# Patient Record
Sex: Male | Born: 1985 | Race: White | Hispanic: No | Marital: Single | Smoking: Current every day smoker
Health system: Southern US, Community
[De-identification: ages and names within clinical notes are randomized; demographics above are authoritative.]

## PROBLEM LIST (undated history)

## (undated) DIAGNOSIS — Z969 Presence of functional implant, unspecified: Secondary | ICD-10-CM

## (undated) DIAGNOSIS — K08109 Complete loss of teeth, unspecified cause, unspecified class: Secondary | ICD-10-CM

## (undated) DIAGNOSIS — R0989 Other specified symptoms and signs involving the circulatory and respiratory systems: Secondary | ICD-10-CM

## (undated) DIAGNOSIS — F319 Bipolar disorder, unspecified: Secondary | ICD-10-CM

## (undated) DIAGNOSIS — Z972 Presence of dental prosthetic device (complete) (partial): Secondary | ICD-10-CM

## (undated) HISTORY — PX: FRACTURE SURGERY: SHX138

---
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
## 2007-11-04 ENCOUNTER — Emergency Department (HOSPITAL_COMMUNITY): Admission: EM | Admit: 2007-11-04 | Discharge: 2007-11-05 | Payer: Self-pay | Admitting: Emergency Medicine

## 2010-12-29 LAB — DIFFERENTIAL
Basophils Relative: 0
Lymphs Abs: 1.7
Monocytes Relative: 6
Neutro Abs: 10.4 — ABNORMAL HIGH
Neutrophils Relative %: 80 — ABNORMAL HIGH

## 2010-12-29 LAB — COMPREHENSIVE METABOLIC PANEL
Alkaline Phosphatase: 84
BUN: 9
CO2: 22
Chloride: 110
GFR calc non Af Amer: >60
Glucose, Bld: 77
Potassium: 3.7
Total Bilirubin: 0.8

## 2010-12-29 LAB — CBC
MCHC: 34.3
RBC: 4.74
WBC: 13 — ABNORMAL HIGH

## 2014-04-22 ENCOUNTER — Emergency Department (HOSPITAL_BASED_OUTPATIENT_CLINIC_OR_DEPARTMENT_OTHER): Payer: Self-pay

## 2014-04-22 ENCOUNTER — Emergency Department (HOSPITAL_BASED_OUTPATIENT_CLINIC_OR_DEPARTMENT_OTHER)
Admission: EM | Admit: 2014-04-22 | Discharge: 2014-04-23 | Disposition: A | Discharge status: Home / Self Care | Payer: Self-pay | Attending: Emergency Medicine | Admitting: Emergency Medicine

## 2014-04-22 ENCOUNTER — Encounter (HOSPITAL_BASED_OUTPATIENT_CLINIC_OR_DEPARTMENT_OTHER): Payer: Self-pay | Admitting: *Deleted

## 2014-04-22 DIAGNOSIS — Z720 Tobacco use: Secondary | ICD-10-CM | POA: Insufficient documentation

## 2014-04-22 DIAGNOSIS — N50811 Right testicular pain: Secondary | ICD-10-CM

## 2014-04-22 DIAGNOSIS — N503 Cyst of epididymis: Secondary | ICD-10-CM | POA: Insufficient documentation

## 2014-04-22 DIAGNOSIS — N508 Other specified disorders of male genital organs: Secondary | ICD-10-CM | POA: Insufficient documentation

## 2014-04-22 DIAGNOSIS — N50819 Testicular pain, unspecified: Secondary | ICD-10-CM

## 2014-04-22 DIAGNOSIS — R52 Pain, unspecified: Secondary | ICD-10-CM

## 2014-04-22 DIAGNOSIS — Z792 Long term (current) use of antibiotics: Secondary | ICD-10-CM | POA: Insufficient documentation

## 2014-04-22 NOTE — ED Provider Notes (Signed)
CSN: [id number]     Arrival date & time 04/22/14  2119 History  This chart was scribed for Frank SeamenJohn L [name], MD by [name] [name], ED Scribe. This patient was seen in room MH08/MH08 and the patient's care was started at 12:01 AM.   Chief Complaint  Patient presents with  . Testicle Pain     HPI Comments: [name] is a 29 y.o. male who presents to the Emergency Department complaining of moderate to severe right testicular pain that began 5 hours ago. Pain is worse with movement and improved with rest in certain positions. Patient denies injury. He denies associated dysuria, flank pain, hematuria or urethral discharge. Patient states he feels "hot" at times. Patient is sexually active, in a monogamous relationship. He states that he does not use condoms.   History reviewed. No pertinent past medical history. History reviewed. No pertinent past surgical history. History reviewed. No pertinent family history. History  Substance Use Topics  . Smoking status: Current Every Day Smoker -- 1.00 packs/day    Types: Cigarettes  . Smokeless tobacco: Not on file  . Alcohol Use: No   Review of Systems   A complete 10 system review of systems was obtained and all systems are negative except as noted in the HPI and PMH.    Allergies  Review of patient's allergies indicates no known allergies.  Home Medications   Prior to Admission medications   Medication Sig Start Date End Date Taking? Authorizing Provider  HYDROcodone-acetaminophen (NORCO/VICODIN) 5-325 MG per tablet Take 1-2 tablets by mouth every 6 (six) hours as needed (for pain). 04/23/14   Carlisle BeersJohn L Jazz Biddy, MD  levofloxacin (LEVAQUIN) 500 MG tablet Take 1 tablet (500 mg total) by mouth daily. 04/23/14   Carlisle BeersJohn L [name], MD   Triage Vitals: BP 138/91 mmHg  Pulse 75  Temp(Src) 97.7 F (36.5 C) (Oral)  Resp 16  Ht 5\' 6"  (1.676 m)  Wt 140 lb (63.504 kg)  BMI 22.61 kg/m2  SpO2 100%  Physical Exam  Nursing note and vitals  reviewed. General: Well-developed, well-nourished male in no acute distress; appearance consistent with age of record HENT: normocephalic; atraumatic Eyes: pupils equal, round and reactive to light; extraocular muscles intact Neck: supple Heart: regular rate and rhythm Lungs: clear to auscultation bilaterally Abdomen: soft; nondistended; nontender; no masses or hepatosplenomegaly; bowel sounds present GU: no CVA tenderness; Tanner 5 male, circumcised; no urethral discharge; palpable non-tender cyst of left epididymus; mild tenderness of right testicle; no hernias palpated in supine and standing upright positions Extremities: No deformity; full range of motion; pulses normal Neurologic: Awake, alert and oriented; motor function intact in all extremities and symmetric; no facial droop Skin: Warm and dry Psychiatric: Normal mood and affect  ED Course  Procedures (including critical care time)  DIAGNOSTIC STUDIES: Oxygen Saturation is 100% on RA, normal by my interpretation.    COORDINATION OF CARE: 12:12 AM- Ordered diagnostic imaging of scrotum. Discussed plans to treat patient for possible epididymitis and/or orchitis and will refer to urology if symptoms do not improve. Pt advised of plan for treatment and pt agrees.   MDM  Nursing notes and vitals signs, including pulse oximetry, reviewed.  Summary of this visit's results, reviewed by myself:  Imaging Studies: Kore[place] Scrotum  04/22/2014   CLINICAL DATA:  Sudden onset of right scrotal pain, bleeding 3 hr ago.  EXAM: SCROTAL ULTRASOUND  DOPPLER ULTRASOUND OF THE TESTICLES  TECHNIQUE: Complete ultrasound examination of the testicles, epididymis, and other scrotal structures  was performed. Color and spectral Doppler ultrasound were also utilized to evaluate blood flow to the testicles.  COMPARISON:  None.  FINDINGS: Right testicle  Measurements: 3.9 cm x 2.5 cm x 2.8 cm. No mass or microlithiasis visualized.  Left testicle  Measurements: 3.8  cm x 2.5 cm x 2.6 cm. No mass or microlithiasis visualized.  Right epididymis:  Normal in size and appearance.  Left epididymis: Epididymal head cyst measuring 14 mm in greatest dimension. No other epididymal abnormality.  Hydrocele:  No significant hydrocele.  Varicocele:  None visualized.  Pulsed Doppler interrogation of both testes demonstrates low resistance arterial and venous waveforms bilaterally.  IMPRESSION: 1. No acute findings. No evidence of a testicular mass, torsion or of epididymitis/ orchitis. 2. 14 mm left epididymal head cyst.  No other abnormalities.   Electronically Signed   By: [name] M.D.   On: 04/22/2014 23:04   [place] Art/ven Flow Abd Pelv Doppler  04/22/2014   CLINICAL DATA:  Sudden onset of right scrotal pain, bleeding 3 hr ago.  EXAM: SCROTAL ULTRASOUND  DOPPLER ULTRASOUND OF THE TESTICLES  TECHNIQUE: Complete ultrasound examination of the testicles, epididymis, and other scrotal structures was performed. Color and spectral Doppler ultrasound were also utilized to evaluate blood flow to the testicles.  COMPARISON:  None.  FINDINGS: Right testicle  Measurements: 3.9 cm x 2.5 cm x 2.8 cm. No mass or microlithiasis visualized.  Left testicle  Measurements: 3.8 cm x 2.5 cm x 2.6 cm. No mass or microlithiasis visualized.  Right epididymis:  Normal in size and appearance.  Left epididymis: Epididymal head cyst measuring 14 mm in greatest dimension. No other epididymal abnormality.  Hydrocele:  No significant hydrocele.  Varicocele:  None visualized.  Pulsed Doppler interrogation of both testes demonstrates low resistance arterial and venous waveforms bilaterally.  IMPRESSION: 1. No acute findings. No evidence of a testicular mass, torsion or of epididymitis/ orchitis. 2. 14 mm left epididymal head cyst.  No other abnormalities.   Electronically Signed   By: [name] M.D.   On: 04/22/2014 23:04     Final diagnoses:  Pain in right testicle  Epididymal cyst    I personally  performed the services described in this documentation, which was scribed in my presence. The recorded information has been reviewed and is accurate.  Frank Seamen, MD 04/23/14 [phone]

## 2014-04-22 NOTE — ED Notes (Signed)
Pt c/o right testicle pain x 2 hrs denies injury

## 2014-04-22 NOTE — ED Notes (Signed)
C/o rt testicular pain x 2.5 hours  Denies inj

## 2014-04-23 LAB — GC/CHLAMYDIA PROBE AMP (~~LOC~~) NOT AT ARMC
Chlamydia: NEGATIVE
Neisseria Gonorrhea: NEGATIVE

## 2014-04-23 MED ORDER — HYDROCODONE-ACETAMINOPHEN 5-325 MG PO TABS: 1.0000 | ORAL_TABLET | Freq: Four times a day (QID) | ORAL | Status: DC | PRN

## 2014-04-23 MED ORDER — LIDOCAINE HCL (PF) 1 % IJ SOLN
INTRAMUSCULAR | Status: AC
  Administered 2014-04-23: 1.2 mL
  Filled 2014-04-23: qty 5

## 2014-04-23 MED ORDER — HYDROCODONE-ACETAMINOPHEN 5-325 MG PO TABS
2.0000 | ORAL_TABLET | Freq: Once | ORAL | Status: AC
  Administered 2014-04-23: 2 via ORAL
  Filled 2014-04-23: qty 2

## 2014-04-23 MED ORDER — CEFTRIAXONE SODIUM 250 MG IJ SOLR
250.0000 mg | Freq: Once | INTRAMUSCULAR | Status: AC
  Administered 2014-04-23: 250 mg via INTRAMUSCULAR
  Filled 2014-04-23: qty 250

## 2014-04-23 MED ORDER — LEVOFLOXACIN 500 MG PO TABS
500.0000 mg | ORAL_TABLET | Freq: Every day | ORAL | Status: DC
Start: 2014-04-23 — End: 2015-08-26

## 2015-05-04 HISTORY — PX: ORIF ANKLE FRACTURE: SHX5408

## 2015-08-01 DIAGNOSIS — Z969 Presence of functional implant, unspecified: Secondary | ICD-10-CM

## 2015-08-01 HISTORY — DX: Presence of functional implant, unspecified: Z96.9

## 2015-08-26 ENCOUNTER — Encounter (HOSPITAL_BASED_OUTPATIENT_CLINIC_OR_DEPARTMENT_OTHER): Payer: Self-pay | Admitting: *Deleted

## 2015-08-26 DIAGNOSIS — R0989 Other specified symptoms and signs involving the circulatory and respiratory systems: Secondary | ICD-10-CM

## 2015-08-26 HISTORY — DX: Other specified symptoms and signs involving the circulatory and respiratory systems: R09.89

## 2015-09-02 ENCOUNTER — Ambulatory Visit (HOSPITAL_BASED_OUTPATIENT_CLINIC_OR_DEPARTMENT_OTHER): Payer: Self-pay | Admitting: Certified Registered"

## 2015-09-02 ENCOUNTER — Encounter (HOSPITAL_BASED_OUTPATIENT_CLINIC_OR_DEPARTMENT_OTHER): Payer: Self-pay | Admitting: Certified Registered"

## 2015-09-02 ENCOUNTER — Ambulatory Visit (HOSPITAL_BASED_OUTPATIENT_CLINIC_OR_DEPARTMENT_OTHER)
Admission: RE | Admit: 2015-09-02 | Discharge: 2015-09-02 | Disposition: A | Discharge status: Home / Self Care | Payer: Self-pay | Source: Ambulatory Visit | Attending: Orthopedic Surgery | Admitting: Orthopedic Surgery

## 2015-09-02 ENCOUNTER — Encounter (HOSPITAL_BASED_OUTPATIENT_CLINIC_OR_DEPARTMENT_OTHER)
Admission: RE | Disposition: A | Discharge status: Home / Self Care | Payer: Self-pay | Source: Ambulatory Visit | Attending: Orthopedic Surgery

## 2015-09-02 DIAGNOSIS — Y838 Other surgical procedures as the cause of abnormal reaction of the patient, or of later complication, without mention of misadventure at the time of the procedure: Secondary | ICD-10-CM | POA: Insufficient documentation

## 2015-09-02 DIAGNOSIS — Z969 Presence of functional implant, unspecified: Secondary | ICD-10-CM

## 2015-09-02 DIAGNOSIS — T84498A Other mechanical complication of other internal orthopedic devices, implants and grafts, initial encounter: Secondary | ICD-10-CM | POA: Insufficient documentation

## 2015-09-02 DIAGNOSIS — F17200 Nicotine dependence, unspecified, uncomplicated: Secondary | ICD-10-CM | POA: Insufficient documentation

## 2015-09-02 HISTORY — DX: Presence of dental prosthetic device (complete) (partial): Z97.2

## 2015-09-02 HISTORY — DX: Other specified symptoms and signs involving the circulatory and respiratory systems: R09.89

## 2015-09-02 HISTORY — PX: HARDWARE REMOVAL: SHX979

## 2015-09-02 HISTORY — DX: Presence of functional implant, unspecified: Z96.9

## 2015-09-02 HISTORY — DX: Complete loss of teeth, unspecified cause, unspecified class: K08.109

## 2015-09-02 SURGERY — REMOVAL, HARDWARE
Anesthesia: General | Site: Ankle | Laterality: Left

## 2015-09-02 MED ORDER — FENTANYL CITRATE (PF) 100 MCG/2ML IJ SOLN
50.0000 ug | INTRAMUSCULAR | Status: AC | PRN
  Administered 2015-09-02: 50 ug via INTRAVENOUS
  Administered 2015-09-02: 100 ug via INTRAVENOUS
  Administered 2015-09-02 (×3): 50 ug via INTRAVENOUS

## 2015-09-02 MED ORDER — FENTANYL CITRATE (PF) 100 MCG/2ML IJ SOLN
INTRAMUSCULAR | Status: AC
  Filled 2015-09-02: qty 2

## 2015-09-02 MED ORDER — ACETAMINOPHEN 500 MG PO TABS
1000.0000 mg | ORAL_TABLET | Freq: Once | ORAL | Status: AC
Start: 2015-09-02 — End: 2015-09-02
  Administered 2015-09-02: 1000 mg via ORAL

## 2015-09-02 MED ORDER — LACTATED RINGERS IV SOLN
INTRAVENOUS | Status: DC
  Administered 2015-09-02 (×2): via INTRAVENOUS

## 2015-09-02 MED ORDER — CEFAZOLIN SODIUM-DEXTROSE 2-4 GM/100ML-% IV SOLN
2.0000 g | INTRAVENOUS | Status: AC
  Administered 2015-09-02: 2 g via INTRAVENOUS

## 2015-09-02 MED ORDER — KETOROLAC TROMETHAMINE 30 MG/ML IJ SOLN
INTRAMUSCULAR | Status: AC
  Filled 2015-09-02: qty 1

## 2015-09-02 MED ORDER — ONDANSETRON HCL 4 MG/2ML IJ SOLN
INTRAMUSCULAR | Status: DC | PRN
  Administered 2015-09-02: 4 mg via INTRAVENOUS

## 2015-09-02 MED ORDER — BUPIVACAINE HCL (PF) 0.5 % IJ SOLN
INTRAMUSCULAR | Status: AC
  Filled 2015-09-02: qty 30

## 2015-09-02 MED ORDER — GLYCOPYRROLATE 0.2 MG/ML IJ SOLN: 0.2000 mg | Freq: Once | INTRAMUSCULAR | Status: DC | PRN

## 2015-09-02 MED ORDER — PROPOFOL 10 MG/ML IV BOLUS
INTRAVENOUS | Status: AC
  Filled 2015-09-02: qty 20

## 2015-09-02 MED ORDER — FENTANYL CITRATE (PF) 100 MCG/2ML IJ SOLN
25.0000 ug | INTRAMUSCULAR | Status: DC | PRN
  Administered 2015-09-02 (×3): 50 ug via INTRAVENOUS

## 2015-09-02 MED ORDER — BUPIVACAINE HCL (PF) 0.5 % IJ SOLN
INTRAMUSCULAR | Status: DC | PRN
  Administered 2015-09-02: 10 mL

## 2015-09-02 MED ORDER — DEXTROSE-NACL 5-0.45 % IV SOLN: 100.0000 mL/h | INTRAVENOUS | Status: DC

## 2015-09-02 MED ORDER — LIDOCAINE HCL (CARDIAC) 20 MG/ML IV SOLN
INTRAVENOUS | Status: DC | PRN
  Administered 2015-09-02: 80 mg via INTRAVENOUS

## 2015-09-02 MED ORDER — MIDAZOLAM HCL 2 MG/2ML IJ SOLN
1.0000 mg | INTRAMUSCULAR | Status: DC | PRN
  Administered 2015-09-02: 2 mg via INTRAVENOUS

## 2015-09-02 MED ORDER — HYDROCODONE-ACETAMINOPHEN 5-325 MG PO TABS: 1.0000 | ORAL_TABLET | ORAL | Status: AC | PRN

## 2015-09-02 MED ORDER — DEXAMETHASONE SODIUM PHOSPHATE 10 MG/ML IJ SOLN
INTRAMUSCULAR | Status: DC | PRN
  Administered 2015-09-02: 10 mg via INTRAVENOUS

## 2015-09-02 MED ORDER — ONDANSETRON HCL 4 MG/2ML IJ SOLN
INTRAMUSCULAR | Status: AC
  Filled 2015-09-02: qty 2

## 2015-09-02 MED ORDER — SCOPOLAMINE 1 MG/3DAYS TD PT72: 1.0000 | MEDICATED_PATCH | Freq: Once | TRANSDERMAL | Status: DC | PRN

## 2015-09-02 MED ORDER — PROMETHAZINE HCL 25 MG/ML IJ SOLN: 6.2500 mg | INTRAMUSCULAR | Status: DC | PRN

## 2015-09-02 MED ORDER — KETOROLAC TROMETHAMINE 30 MG/ML IJ SOLN
30.0000 mg | Freq: Once | INTRAMUSCULAR | Status: AC | PRN
  Administered 2015-09-02: 30 mg via INTRAVENOUS

## 2015-09-02 MED ORDER — PROPOFOL 10 MG/ML IV BOLUS
INTRAVENOUS | Status: DC | PRN
  Administered 2015-09-02: 200 mg via INTRAVENOUS

## 2015-09-02 MED ORDER — DEXAMETHASONE SODIUM PHOSPHATE 10 MG/ML IJ SOLN
INTRAMUSCULAR | Status: AC
  Filled 2015-09-02: qty 1

## 2015-09-02 MED ORDER — CEFAZOLIN SODIUM-DEXTROSE 2-4 GM/100ML-% IV SOLN
INTRAVENOUS | Status: AC
  Filled 2015-09-02: qty 100

## 2015-09-02 MED ORDER — CHLORHEXIDINE GLUCONATE 4 % EX LIQD: 60.0000 mL | Freq: Once | CUTANEOUS | Status: DC

## 2015-09-02 MED ORDER — MIDAZOLAM HCL 2 MG/2ML IJ SOLN
INTRAMUSCULAR | Status: AC
  Filled 2015-09-02: qty 2

## 2015-09-02 MED ORDER — ACETAMINOPHEN 500 MG PO TABS
ORAL_TABLET | ORAL | Status: AC
  Filled 2015-09-02: qty 2

## 2015-09-02 MED ORDER — LIDOCAINE 2% (20 MG/ML) 5 ML SYRINGE
INTRAMUSCULAR | Status: AC
  Filled 2015-09-02: qty 5

## 2015-09-02 SURGICAL SUPPLY — 67 items
BANDAGE ACE 4X5 VEL STRL LF (GAUZE/BANDAGES/DRESSINGS) ×3 IMPLANT
BLADE SURG 15 STRL LF DISP TIS (BLADE) ×1 IMPLANT
BLADE SURG 15 STRL SS (BLADE) ×3
BNDG CMPR 9X4 STRL LF SNTH (GAUZE/BANDAGES/DRESSINGS) ×1
BNDG COHESIVE 4X5 TAN STRL (GAUZE/BANDAGES/DRESSINGS) ×3 IMPLANT
BNDG ESMARK 4X9 LF (GAUZE/BANDAGES/DRESSINGS) ×3 IMPLANT
CHLORAPREP W/TINT 26ML (MISCELLANEOUS) ×3 IMPLANT
CLOSURE STERI-STRIP 1/2X4 (GAUZE/BANDAGES/DRESSINGS) ×1
CLSR STERI-STRIP ANTIMIC 1/2X4 (GAUZE/BANDAGES/DRESSINGS) ×2 IMPLANT
COVER BACK TABLE 60X90IN (DRAPES) ×3 IMPLANT
CUFF TOURNIQUET SINGLE 24IN (TOURNIQUET CUFF) ×2 IMPLANT
CUFF TOURNIQUET SINGLE 34IN LL (TOURNIQUET CUFF) IMPLANT
DECANTER SPIKE VIAL GLASS SM (MISCELLANEOUS) IMPLANT
DRAPE EXTREMITY T 121X128X90 (DRAPE) ×3 IMPLANT
DRAPE IMP U-DRAPE 54X76 (DRAPES) ×3 IMPLANT
DRAPE OEC MINIVIEW 54X84 (DRAPES) ×3 IMPLANT
DRAPE SURG 17X23 STRL (DRAPES) IMPLANT
DRAPE U-SHAPE 47X51 STRL (DRAPES) IMPLANT
DRSG EMULSION OIL 3X3 NADH (GAUZE/BANDAGES/DRESSINGS) ×3 IMPLANT
ELECT REM PT RETURN 9FT ADLT (ELECTROSURGICAL) ×3
ELECTRODE REM PT RTRN 9FT ADLT (ELECTROSURGICAL) ×1 IMPLANT
GAUZE SPONGE 4X4 12PLY STRL (GAUZE/BANDAGES/DRESSINGS) ×3 IMPLANT
GAUZE XEROFORM 1X8 LF (GAUZE/BANDAGES/DRESSINGS) IMPLANT
GLOVE BIO SURGEON STRL SZ 6.5 (GLOVE) ×1 IMPLANT
GLOVE BIO SURGEON STRL SZ7.5 (GLOVE) ×6 IMPLANT
GLOVE BIO SURGEON STRL SZ8.5 (GLOVE) ×2 IMPLANT
GLOVE BIO SURGEONS STRL SZ 6.5 (GLOVE) ×1
GLOVE BIOGEL PI IND STRL 7.0 (GLOVE) IMPLANT
GLOVE BIOGEL PI IND STRL 8 (GLOVE) ×2 IMPLANT
GLOVE BIOGEL PI IND STRL 8.5 (GLOVE) IMPLANT
GLOVE BIOGEL PI INDICATOR 7.0 (GLOVE) ×2
GLOVE BIOGEL PI INDICATOR 8 (GLOVE) ×4
GLOVE BIOGEL PI INDICATOR 8.5 (GLOVE) ×2
GOWN STRL REUS W/ TWL LRG LVL3 (GOWN DISPOSABLE) ×4 IMPLANT
GOWN STRL REUS W/ TWL XL LVL3 (GOWN DISPOSABLE) ×1 IMPLANT
GOWN STRL REUS W/TWL LRG LVL3 (GOWN DISPOSABLE) ×6
GOWN STRL REUS W/TWL XL LVL3 (GOWN DISPOSABLE) ×6
NDL HYPO 25X1 1.5 SAFETY (NEEDLE) ×1 IMPLANT
NEEDLE HYPO 25X1 1.5 SAFETY (NEEDLE) ×3 IMPLANT
NS IRRIG 1000ML POUR BTL (IV SOLUTION) ×3 IMPLANT
PACK BASIN DAY SURGERY FS (CUSTOM PROCEDURE TRAY) ×3 IMPLANT
PAD CAST 4YDX4 CTTN HI CHSV (CAST SUPPLIES) ×1 IMPLANT
PADDING CAST ABS 4INX4YD NS (CAST SUPPLIES) ×2
PADDING CAST ABS COTTON 4X4 ST (CAST SUPPLIES) ×1 IMPLANT
PADDING CAST COTTON 4X4 STRL (CAST SUPPLIES) ×3
PENCIL BUTTON HOLSTER BLD 10FT (ELECTRODE) ×3 IMPLANT
SHEET MEDIUM DRAPE 40X70 STRL (DRAPES) IMPLANT
SLEEVE SCD COMPRESS KNEE MED (MISCELLANEOUS) IMPLANT
SPONGE LAP 4X18 X RAY DECT (DISPOSABLE) ×3 IMPLANT
STOCKINETTE 4X48 STRL (DRAPES) IMPLANT
STOCKINETTE 6  STRL (DRAPES) ×2
STOCKINETTE 6 STRL (DRAPES) ×1 IMPLANT
SUCTION FRAZIER HANDLE 10FR (MISCELLANEOUS)
SUCTION TUBE FRAZIER 10FR DISP (MISCELLANEOUS) IMPLANT
SUT ETHILON 3 0 PS 1 (SUTURE) ×2 IMPLANT
SUT MON AB 4-0 PC3 18 (SUTURE) IMPLANT
SUT PROLENE 3 0 PS 2 (SUTURE) IMPLANT
SUT VIC AB 2-0 SH 27 (SUTURE) ×3
SUT VIC AB 2-0 SH 27XBRD (SUTURE) IMPLANT
SUT VIC AB 3-0 FS2 27 (SUTURE) IMPLANT
SYR BULB 3OZ (MISCELLANEOUS) ×3 IMPLANT
SYR CONTROL 10ML LL (SYRINGE) IMPLANT
TOWEL OR 17X24 6PK STRL BLUE (TOWEL DISPOSABLE) ×3 IMPLANT
TOWEL OR NON WOVEN STRL DISP B (DISPOSABLE) ×3 IMPLANT
TUBE CONNECTING 20'X1/4 (TUBING)
TUBE CONNECTING 20X1/4 (TUBING) IMPLANT
UNDERPAD 30X30 (UNDERPADS AND DIAPERS) ×3 IMPLANT

## 2015-09-02 NOTE — Discharge Instructions (Signed)
  Post Anesthesia Home Care Instructions  Activity: Get plenty of rest for the remainder of the day. A responsible adult should stay with you for 24 hours following the procedure.  For the next 24 hours, DO NOT: -Drive a car -Operate machinery -Drink alcoholic beverages -Take any medication unless instructed by your physician -Make any legal decisions or sign important papers.  Meals: Start with liquid foods such as gelatin or soup. Progress to regular foods as tolerated. Avoid greasy, spicy, heavy foods. If nausea and/or vomiting occur, drink only clear liquids until the nausea and/or vomiting subsides. Call your physician if vomiting continues.  Special Instructions/Symptoms: Your throat may feel dry or sore from the anesthesia or the breathing tube placed in your throat during surgery. If this causes discomfort, gargle with warm salt water. The discomfort should disappear within 24 hours.  If you had a scopolamine patch placed behind your ear for the management of post- operative nausea and/or vomiting:  1. The medication in the patch is effective for 72 hours, after which it should be removed.  Wrap patch in a tissue and discard in the trash. Wash hands thoroughly with soap and water. 2. You may remove the patch earlier than 72 hours if you experience unpleasant side effects which may include dry mouth, dizziness or visual disturbances. 3. Avoid touching the patch. Wash your hands with soap and water after contact with the patch.    Post Anesthesia Home Care Instructions  Activity: Get plenty of rest for the remainder of the day. A responsible adult should stay with you for 24 hours following the procedure.  For the next 24 hours, DO NOT: -Drive a car -Operate machinery -Drink alcoholic beverages -Take any medication unless instructed by your physician -Make any legal decisions or sign important papers.  Meals: Start with liquid foods such as gelatin or soup. Progress to  regular foods as tolerated. Avoid greasy, spicy, heavy foods. If nausea and/or vomiting occur, drink only clear liquids until the nausea and/or vomiting subsides. Call your physician if vomiting continues.  Special Instructions/Symptoms: Your throat may feel dry or sore from the anesthesia or the breathing tube placed in your throat during surgery. If this causes discomfort, gargle with warm salt water. The discomfort should disappear within 24 hours.  If you had a scopolamine patch placed behind your ear for the management of post- operative nausea and/or vomiting:  1. The medication in the patch is effective for 72 hours, after which it should be removed.  Wrap patch in a tissue and discard in the trash. Wash hands thoroughly with soap and water. 2. You may remove the patch earlier than 72 hours if you experience unpleasant side effects which may include dry mouth, dizziness or visual disturbances. 3. Avoid touching the patch. Wash your hands with soap and water after contact with the patch.    

## 2015-09-02 NOTE — Op Note (Signed)
09/02/2015  12:50 PM  PATIENT:  [name] J Santori    PRE-OPERATIVE DIAGNOSIS:  Other mechanical complication of the other internal orthopedic devices, implants and grafts, initial encounter  T84.498A  POST-OPERATIVE DIAGNOSIS:  Same  PROCEDURE:  LEFT ANKLE HARDWARE REMOVAL  SURGEON:  Justyna Timoney, Jewel BaizeIMOTHY D, MD  ASSISTANT: Dennard SchaumannBrian petrarcha, PA-C, She was present and scrubbed throughout the case, critical for completion in a timely fashion, and for retraction, instrumentation, and closure.   ANESTHESIA:   gen  PREOPERATIVE INDICATIONS:  Frank LeydenCody J Pierpoint is a  30 y.o. male with a diagnosis of Other mechanical complication of the other internal orthopedic devices, implants and grafts, initial encounter  T84.498A who failed conservative measures and elected for surgical management.    The risks benefits and alternatives were discussed with the patient preoperatively including but not limited to the risks of infection, bleeding, nerve injury, cardiopulmonary complications, the need for revision surgery, among others, and the patient was willing to proceed.  OPERATIVE IMPLANTS: none  OPERATIVE FINDINGS: complete removal of hardware  BLOOD LOSS: min  COMPLICATIONS: none  TOURNIQUET TIME: 30  OPERATIVE PROCEDURE:  Patient was identified in the preoperative holding area and site was marked by me He was transported to the operating theater and placed on the table in supine position taking care to pad all bony prominences. After a preincinduction time out anesthesia was induced. The left lower extremity was prepped and draped in normal sterile fashion and a pre-incision timeout was performed. He received ancef for preoperative antibiotics.   Made a lateral incision protecting superficial peroneal nerve. I dissected down to his periosteum incised this in line with the incision and medially identified his plate I removed the 2 large syndesmotic screws I then removed his other 4 screws.  X-rays a Freer to  completely remove the plate. They came out intact and whole  Next I placed a Hohmann across the front of the fibula was able to see his interlock screw so I'm remove this completely as well. I then thoroughly irrigated his incision I closed his periosteum over top of the bone I closed the skin with a nylon stitch. Sterile dressing was applied and he was awoken and taken the PACU in stable condition.  POST OPERATIVE PLAN: Weightbearing as tolerated no high energy activity for 1 month ambulate for DVT prophylaxis    This note was generated using a template and dragon dictation system. In light of that, I have reviewed the note and all aspects of it are applicable to this case. Any dictation errors are due to the computerized dictation system.

## 2015-09-02 NOTE — H&P (Signed)
  [facility] [address]   [address] [place], [place] [id number] [phone] A Division of [facility]  Loreta Aveaniel F. Deontaye Civello, M.D.   Robert A. Thurston HoleWainer, M.D.   Burnell BlanksW. Dan Caffrey, M.D.   Eulas PostJoshua P. Landau, M.D.   Lunette StandsAnna Voytek, M.D. Jewel Baizeimothy D. Eulah PontMurphy, M.D.  Buford DresserWesley R. Ibazebo, M.D.  Estell HarpinJames S. Kramer, M.D.    Melina Fiddlerebecca S. Bassett, M.D. Janalee DaneBrittney Kelly, PA- C  Mary L. Dub MikesStanbery, PA-C  Kirstin A. Shepperson, PA-C  Josh Gaphadwell, PA-C  LewisvilleBrandon Parry, [place] Dakota[place]A-C  RE: Frank LawrenceHutson, [name]   [id number]      DOB: 08-09-1985  PROGRESS REPORT: 07-18-15  REASON FOR VISIT: Follow-up open reduction internal fixation left ankle lateral malleolus and syndesmosis performed at an outside facility on 05-13-15.   HPI:  Frank BattenCody is two months out. He has recovered well. He has advanced to full weight bearing in a regular shoe part time and in a boot part time. His hardware is prominent. We discussed hardware removal in May. He is all set for this.   Please see associated documentation for this clinic visit for further past medical, family, surgical and social history, review of systems, and exam findings as this was reviewed by me.  EXAMINATION: Well appearing male no apparent distress. The left lower extremity incisions are well healed. He has prominent hardware which is tender. He has a good range of motion of the ankle. No swelling.    IMAGES: X-rays reviewed by me:   X-rays show a stable alignment of his hardware.    ASSESSMENT/PLAN: He is doing well at this time. We will continue with our plan for hardware removal in May. He will continue to advance his activity in the meantime.   Jewel Baizeimothy D.  Eulah PontMurphy, M.D.  Electronically verified by Jewel Baizeimothy D. Eulah PontMurphy, M.D. TDM:BR:jgc  D  07-20-2015 T  07-20-2015

## 2015-09-02 NOTE — Anesthesia Preprocedure Evaluation (Signed)
Anesthesia Evaluation  Patient identified by MRN, date of birth, ID band Patient awake    Reviewed: Allergy & Precautions, NPO status , Patient's Chart, lab work & pertinent test results  Airway Mallampati: II  TM Distance: >3 FB Neck ROM: Full    Dental no notable dental hx.    Pulmonary Current Smoker,    Pulmonary exam normal breath sounds clear to auscultation       Cardiovascular negative cardio ROS Normal cardiovascular exam Rhythm:Regular Rate:Normal     Neuro/Psych negative neurological ROS  negative psych ROS   GI/Hepatic negative GI ROS, Neg liver ROS,   Endo/Other  negative endocrine ROS  Renal/GU negative Renal ROS  negative genitourinary   Musculoskeletal negative musculoskeletal ROS (+)   Abdominal   Peds negative pediatric ROS (+)  Hematology negative hematology ROS (+)   Anesthesia Other Findings   Reproductive/Obstetrics negative OB ROS                             Anesthesia Physical Anesthesia Plan  ASA: II  Anesthesia Plan: General   Post-op Pain Management:    Induction: Intravenous  Airway Management Planned: LMA  Additional Equipment:   Intra-op Plan:   Post-operative Plan: Extubation in OR  Informed Consent: I have reviewed the patients History and Physical, chart, labs and discussed the procedure including the risks, benefits and alternatives for the proposed anesthesia with the patient or authorized representative who has indicated his/her understanding and acceptance.   Dental advisory given  Plan Discussed with: CRNA and Surgeon  Anesthesia Plan Comments:         Anesthesia Quick Evaluation  

## 2015-09-02 NOTE — Anesthesia Postprocedure Evaluation (Signed)
Anesthesia Post Note  Patient: [name]  Procedure(s) Performed: Procedure(s) (LRB): LEFT ANKLE HARDWARE REMOVAL (Left)  Patient location during evaluation: PACU Anesthesia Type: General Level of consciousness: awake and alert Pain management: pain level controlled Vital Signs Assessment: post-procedure vital signs reviewed and stable Respiratory status: spontaneous breathing, nonlabored ventilation, respiratory function stable and patient connected to nasal cannula oxygen Cardiovascular status: blood pressure returned to baseline and stable Postop Assessment: no signs of nausea or vomiting Anesthetic complications: no    Last Vitals:  Filed Vitals:   09/02/15 1330 09/02/15 1345  BP: 144/99 140/103  Pulse: 109 96  Temp:    Resp: 17 25    Last Pain:  Filed Vitals:   09/02/15 1347  PainSc: 7                  Khani Paino S

## 2015-09-02 NOTE — Anesthesia Procedure Notes (Signed)
Procedure Name: LMA Insertion Date/Time: 09/02/2015 12:27 PM Performed by: Curly ShoresRAFT, Laquiesha Piacente W Pre-anesthesia Checklist: Patient identified, Emergency Drugs available, Suction available and Patient being monitored Patient Re-evaluated:Patient Re-evaluated prior to inductionOxygen Delivery Method: Circle system utilized Preoxygenation: Pre-oxygenation with 100% oxygen Intubation Type: IV induction Ventilation: Mask ventilation without difficulty LMA: LMA inserted LMA Size: 4.0 Number of attempts: 1 Airway Equipment and Method: Bite block Placement Confirmation: positive ETCO2 and breath sounds checked- equal and bilateral Tube secured with: Tape Dental Injury: Teeth and Oropharynx as per pre-operative assessment

## 2015-09-02 NOTE — Interval H&P Note (Signed)
History and Physical Interval Note:  09/02/2015 12:17 PM  [name]  has presented today for surgery, with the diagnosis of Other mechanical complication of the other internal orthopedic devices, implants and grafts, initial encounter  T84.498A  The various methods of treatment have been discussed with the patient and family. After consideration of risks, benefits and other options for treatment, the patient has consented to  Procedure(s): LEFT ANKLE HARDWARE REMOVAL (Left) as a surgical intervention .  The patient's history has been reviewed, patient examined, no change in status, stable for surgery.  I have reviewed the patient's chart and labs.  Questions were answered to the patient's satisfaction.     Chauncy Mangiaracina D

## 2015-09-02 NOTE — Transfer of Care (Signed)
Immediate Anesthesia Transfer of Care Note  Patient: Frank LeydenCody J Stodghill  Procedure(s) Performed: Procedure(s): LEFT ANKLE HARDWARE REMOVAL (Left)  Patient Location: PACU  Anesthesia Type:General  Level of Consciousness: awake, alert , oriented and patient cooperative  Airway & Oxygen Therapy: Patient Spontanous Breathing and Patient connected to face mask oxygen  Post-op Assessment: Report given to RN and Post -op Vital signs reviewed and stable  Post vital signs: Reviewed and stable  Last Vitals: There were no vitals filed for this visit.  Last Pain:  Filed Vitals:   09/02/15 1128  PainSc: 3          Complications: No apparent anesthesia complications

## 2015-09-05 ENCOUNTER — Encounter (HOSPITAL_BASED_OUTPATIENT_CLINIC_OR_DEPARTMENT_OTHER): Payer: Self-pay | Admitting: Orthopedic Surgery

## 2016-12-18 ENCOUNTER — Emergency Department (HOSPITAL_COMMUNITY): Payer: Self-pay

## 2016-12-18 ENCOUNTER — Encounter (HOSPITAL_COMMUNITY): Payer: Self-pay | Admitting: Emergency Medicine

## 2016-12-18 ENCOUNTER — Emergency Department (HOSPITAL_COMMUNITY)
Admission: EM | Admit: 2016-12-18 | Discharge: 2016-12-19 | Disposition: A | Discharge status: Home / Self Care | Payer: Self-pay | Attending: Emergency Medicine | Admitting: Emergency Medicine

## 2016-12-18 DIAGNOSIS — Y999 Unspecified external cause status: Secondary | ICD-10-CM | POA: Insufficient documentation

## 2016-12-18 DIAGNOSIS — Y939 Activity, unspecified: Secondary | ICD-10-CM | POA: Insufficient documentation

## 2016-12-18 DIAGNOSIS — F17210 Nicotine dependence, cigarettes, uncomplicated: Secondary | ICD-10-CM | POA: Insufficient documentation

## 2016-12-18 DIAGNOSIS — S99922A Unspecified injury of left foot, initial encounter: Secondary | ICD-10-CM | POA: Insufficient documentation

## 2016-12-18 DIAGNOSIS — W19XXXA Unspecified fall, initial encounter: Secondary | ICD-10-CM

## 2016-12-18 DIAGNOSIS — Y929 Unspecified place or not applicable: Secondary | ICD-10-CM | POA: Insufficient documentation

## 2016-12-18 DIAGNOSIS — W1789XA Other fall from one level to another, initial encounter: Secondary | ICD-10-CM | POA: Insufficient documentation

## 2016-12-18 DIAGNOSIS — S92009A Unspecified fracture of unspecified calcaneus, initial encounter for closed fracture: Secondary | ICD-10-CM | POA: Insufficient documentation

## 2016-12-18 MED ORDER — ONDANSETRON HCL 4 MG/2ML IJ SOLN
4.0000 mg | Freq: Once | INTRAMUSCULAR | Status: AC
Start: 2016-12-18 — End: 2016-12-18
  Administered 2016-12-18: 4 mg via INTRAVENOUS
  Filled 2016-12-18: qty 2

## 2016-12-18 MED ORDER — OXYCODONE-ACETAMINOPHEN 5-325 MG PO TABS
2.0000 | ORAL_TABLET | Freq: Once | ORAL | Status: AC
  Administered 2016-12-18: 2 via ORAL
  Filled 2016-12-18: qty 2

## 2016-12-18 MED ORDER — FENTANYL CITRATE (PF) 100 MCG/2ML IJ SOLN
100.0000 ug | INTRAMUSCULAR | Status: DC | PRN
  Administered 2016-12-18: 100 ug via INTRAVENOUS
  Filled 2016-12-18: qty 2

## 2016-12-18 NOTE — ED Triage Notes (Signed)
[facility] , [facility] , pt. jumped off from second floor at around 8pm this evening, denied LOC ,denied SI, pt. Complained of pain on bil. Ankles,feet at 10./10. Pt. Was barefooted , no report of obvious injury. C-collar in placed. Pt. Alert and oriented x4. Denied HA.

## 2016-12-18 NOTE — ED Notes (Signed)
Pt. Given 2 ice packs. 

## 2016-12-19 ENCOUNTER — Emergency Department (HOSPITAL_COMMUNITY): Payer: Self-pay

## 2016-12-19 MED ORDER — OXYCODONE-ACETAMINOPHEN 5-325 MG PO TABS: 2.0000 | ORAL_TABLET | ORAL | 0 refills | Status: AC | PRN

## 2016-12-19 MED ORDER — HYDROMORPHONE HCL 1 MG/ML IJ SOLN
1.0000 mg | INTRAMUSCULAR | Status: AC | PRN
  Administered 2016-12-19 (×2): 1 mg via INTRAVENOUS
  Filled 2016-12-19 (×2): qty 1

## 2016-12-19 NOTE — Discharge Instructions (Signed)
Both of your ankles/feet have fractures of the heel bone, called the Calcaneous. These often need surgery to fix. The surgery is not done immediately as these injuries cause too much swelling to be able to close the skin. Call Dr. Roda Shutters (Pronounced Deno Etienne) for an appointment in one week. You absolutely cannot bear any weight on your feet until seen by orthopedics, and likely until after surgery

## 2016-12-19 NOTE — Progress Notes (Signed)
Orthopedic Tech Progress Note Patient Details:  [name] 06/08/1985 [id number]  Ortho Devices Type of Ortho Device: Lenora Boys splint, Post (short leg) splint Ortho Device/Splint Location: bi watson jones with posterior short leg splint. watson jones as per drs request Ortho Device/Splint Interventions: Ordered, Application   Trinna Post 12/19/2016, 1:52 AM

## 2016-12-25 ENCOUNTER — Ambulatory Visit (INDEPENDENT_AMBULATORY_CARE_PROVIDER_SITE_OTHER): Admitting: Orthopedic Surgery

## 2016-12-25 ENCOUNTER — Other Ambulatory Visit (INDEPENDENT_AMBULATORY_CARE_PROVIDER_SITE_OTHER): Payer: Self-pay | Admitting: Orthopedic Surgery

## 2016-12-25 DIAGNOSIS — S92002A Unspecified fracture of left calcaneus, initial encounter for closed fracture: Secondary | ICD-10-CM

## 2016-12-25 DIAGNOSIS — S92001A Unspecified fracture of right calcaneus, initial encounter for closed fracture: Secondary | ICD-10-CM | POA: Insufficient documentation

## 2016-12-25 NOTE — Progress Notes (Signed)
Office Visit Note   Patient: [name]           Date of Birth: Sep 23, 1985           MRN: [id number] Visit Date: 12/25/2016              Requested by: Delorse Lek, MD [address] [place], [place] [address] PCP: Delorse Lek, MD  No chief complaint on file.     HPI: Patient is a 31 year old gentleman who states he jumped out of a second-story window landing on both feet on concrete. Patient sustained bilateral calcaneal fractures 1 week ago. Patient is seen today for initial valuation.  Of note patient had a left fibular and syndesmosis injury approximately 2 years ago this underwent open reduction internal fixation and subsequently the hardware was removed by Dr. Renaye Rakers on 6/17, 1 year in 3 months ago.  Assessment & Plan: Visit Diagnoses:  1. Closed fracture of both calcanei, initial encounter     Plan: We will plan for open reduction internal fixation of bilateral calcaneal fractures. Risks and benefits were discussed including arthritis pain and need for additional surgery potential for subtalar fusion potential for left foot amputation due to the previous lateral surgical incision. Patient states he understands wishes to proceed at this time. We will have to [facility] to identify time for surgery.  Follow-Up Instructions: Return in about 1 week (around 01/01/2017).   Ortho Exam  Patient is alert, oriented, no adenopathy, well-dressed, normal affect, normal respiratory effort. Examination patient has good hair growth on both feet. He has good dorsalis pedis pulses bilaterally. There is more swelling in the left lateral aspect the calcaneus compared to the right there is ecchymosis and bruising that goes down into the toes. There is no skin blistering. The lateral incision over the fibula extends down into the soft tissue of the lateral calcaneus.  Review the radiographs shows a tongue type minimally displaced right posterior  facet calcaneus fracture. Radiographs shows a comminuted calcaneal fracture on the left with comminution of the posterior facet a small fracture off the posterior aspect of the talus and a nonunion of the Weber B fibular fracture on the left.  Imaging: No results found. No images are attached to the encounter.  Labs: No results found for: HGBA1C, ESRSEDRATE, CRP, LABURIC, REPTSTATUS, GRAMSTAIN, CULT, LABORGA  Orders:  No orders of the defined types were placed in this encounter.  No orders of the defined types were placed in this encounter.    Procedures: No procedures performed  Clinical Data: No additional findings.  ROS:  All other systems negative, except as noted in the HPI. Review of Systems  Objective: Vital Signs: There were no vitals taken for this visit.  Specialty Comments:  No specialty comments available.  PMFS History: Patient Active Problem List   Diagnosis Date Noted  . Closed fracture of both calcanei 12/25/2016  . Retained orthopedic hardware 09/02/2015   Past Medical History:  Diagnosis Date  . Full dentures   . Retained orthopedic hardware 08/2015   left ankle  . Runny nose 08/26/2015   clear drainage, per pt.    No family history on file.  Past Surgical History:  Procedure Laterality Date  . HARDWARE REMOVAL Left 09/02/2015   Procedure: LEFT ANKLE HARDWARE REMOVAL;  Surgeon: Sheral Apley, MD;  Location: [facility];  Service: Orthopedics;  Laterality: Left;  . ORIF ANKLE FRACTURE Left 05/2015  Social History   Occupational History  . Not on file.   Social History Main Topics  . Smoking status: Current Every Day Smoker    Packs/day: 1.00    Years: 10.00    Types: Cigarettes  . Smokeless tobacco: Never Used  . Alcohol use Yes     Comment: 2-3 beers/day  . Drug use: No  . Sexual activity: No

## 2016-12-25 NOTE — Progress Notes (Signed)
Patient is Academic librarian at [employer]. Spoke to April Hancock, RN at [facility] and requested patient be NPO past midnight, send medication list of medications and last dose administered. I advised that she can give patient pain medications and all mental health medications with sip of water.

## 2016-12-26 ENCOUNTER — Inpatient Hospital Stay (HOSPITAL_COMMUNITY): Admitting: Anesthesiology

## 2016-12-26 ENCOUNTER — Encounter (HOSPITAL_COMMUNITY): Admission: RE | Payer: Self-pay | Source: Ambulatory Visit | Attending: Orthopedic Surgery

## 2016-12-26 ENCOUNTER — Encounter (HOSPITAL_COMMUNITY): Payer: Self-pay | Admitting: *Deleted

## 2016-12-26 ENCOUNTER — Ambulatory Visit (HOSPITAL_COMMUNITY)
Admission: RE | Admit: 2016-12-26 | Discharge: 2016-12-27 | Source: Ambulatory Visit | Attending: Orthopedic Surgery | Admitting: Orthopedic Surgery

## 2016-12-26 DIAGNOSIS — S92001S Unspecified fracture of right calcaneus, sequela: Secondary | ICD-10-CM

## 2016-12-26 DIAGNOSIS — S92001A Unspecified fracture of right calcaneus, initial encounter for closed fracture: Secondary | ICD-10-CM | POA: Insufficient documentation

## 2016-12-26 DIAGNOSIS — F17200 Nicotine dependence, unspecified, uncomplicated: Secondary | ICD-10-CM | POA: Diagnosis not present

## 2016-12-26 DIAGNOSIS — S92002S Unspecified fracture of left calcaneus, sequela: Secondary | ICD-10-CM

## 2016-12-26 DIAGNOSIS — S92002A Unspecified fracture of left calcaneus, initial encounter for closed fracture: Principal | ICD-10-CM | POA: Insufficient documentation

## 2016-12-26 DIAGNOSIS — W134XXA Fall from, out of or through window, initial encounter: Secondary | ICD-10-CM | POA: Diagnosis not present

## 2016-12-26 HISTORY — PX: ORIF CALCANEOUS FRACTURE: SHX5030

## 2016-12-26 HISTORY — DX: Bipolar disorder, unspecified: F31.9

## 2016-12-26 HISTORY — PX: ORIF CALCANEAL FRACTURE: SUR921

## 2016-12-26 LAB — CBC
HEMATOCRIT: 41.1 % (ref 39.0–52.0)
Hemoglobin: 13.6 g/dL (ref 13.0–17.0)
MCH: 29.8 pg (ref 26.0–34.0)
MCHC: 33.1 g/dL (ref 30.0–36.0)
MCV: 90.1 fL (ref 78.0–100.0)
PLATELETS: 322 10*3/uL (ref 150–400)
RBC: 4.56 MIL/uL (ref 4.22–5.81)
RDW: 13.2 % (ref 11.5–15.5)
WBC: 7.9 10*3/uL (ref 4.0–10.5)

## 2016-12-26 SURGERY — OPEN REDUCTION INTERNAL FIXATION (ORIF) CALCANEOUS FRACTURE
Anesthesia: General | Site: Foot | Laterality: Bilateral

## 2016-12-26 MED ORDER — HYDROMORPHONE HCL 1 MG/ML IJ SOLN
1.0000 mg | INTRAMUSCULAR | Status: DC | PRN
  Administered 2016-12-26 – 2016-12-27 (×5): 1 mg via INTRAVENOUS
  Filled 2016-12-26 (×5): qty 1

## 2016-12-26 MED ORDER — FENTANYL CITRATE (PF) 100 MCG/2ML IJ SOLN
INTRAMUSCULAR | Status: AC
  Administered 2016-12-26: 100 ug via INTRAVENOUS
  Filled 2016-12-26: qty 2

## 2016-12-26 MED ORDER — METHOCARBAMOL 500 MG PO TABS
500.0000 mg | ORAL_TABLET | Freq: Four times a day (QID) | ORAL | Status: DC | PRN
  Administered 2016-12-27: 500 mg via ORAL
  Filled 2016-12-26: qty 1

## 2016-12-26 MED ORDER — CHLORHEXIDINE GLUCONATE 4 % EX LIQD: 60.0000 mL | Freq: Once | CUTANEOUS | Status: DC

## 2016-12-26 MED ORDER — CEFAZOLIN SODIUM-DEXTROSE 2-4 GM/100ML-% IV SOLN: 2.0000 g | INTRAVENOUS | Status: DC

## 2016-12-26 MED ORDER — LIDOCAINE 2% (20 MG/ML) 5 ML SYRINGE
INTRAMUSCULAR | Status: AC
  Filled 2016-12-26: qty 10

## 2016-12-26 MED ORDER — METOCLOPRAMIDE HCL 5 MG PO TABS: 5.0000 mg | ORAL_TABLET | Freq: Three times a day (TID) | ORAL | Status: DC | PRN

## 2016-12-26 MED ORDER — ACETAMINOPHEN 650 MG RE SUPP: 650.0000 mg | Freq: Four times a day (QID) | RECTAL | Status: DC | PRN

## 2016-12-26 MED ORDER — CEFAZOLIN SODIUM-DEXTROSE 1-4 GM/50ML-% IV SOLN
1.0000 g | Freq: Four times a day (QID) | INTRAVENOUS | Status: DC
  Administered 2016-12-26 – 2016-12-27 (×2): 1 g via INTRAVENOUS
  Filled 2016-12-26 (×3): qty 50

## 2016-12-26 MED ORDER — FENTANYL CITRATE (PF) 250 MCG/5ML IJ SOLN
INTRAMUSCULAR | Status: AC
  Filled 2016-12-26: qty 5

## 2016-12-26 MED ORDER — HYDROMORPHONE HCL 1 MG/ML IJ SOLN
0.2500 mg | INTRAMUSCULAR | Status: DC | PRN
  Administered 2016-12-26 (×2): 0.5 mg via INTRAVENOUS

## 2016-12-26 MED ORDER — PHENYLEPHRINE 40 MCG/ML (10ML) SYRINGE FOR IV PUSH (FOR BLOOD PRESSURE SUPPORT)
PREFILLED_SYRINGE | INTRAVENOUS | Status: AC
  Filled 2016-12-26: qty 20

## 2016-12-26 MED ORDER — BISACODYL 10 MG RE SUPP: 10.0000 mg | Freq: Every day | RECTAL | Status: DC | PRN

## 2016-12-26 MED ORDER — MIDAZOLAM HCL 2 MG/2ML IJ SOLN
INTRAMUSCULAR | Status: AC
  Filled 2016-12-26: qty 2

## 2016-12-26 MED ORDER — PROPOFOL 10 MG/ML IV BOLUS
INTRAVENOUS | Status: DC | PRN
  Administered 2016-12-26: 250 mg via INTRAVENOUS

## 2016-12-26 MED ORDER — ASPIRIN EC 325 MG PO TBEC
325.0000 mg | DELAYED_RELEASE_TABLET | Freq: Every day | ORAL | Status: DC
  Administered 2016-12-26 – 2016-12-27 (×2): 325 mg via ORAL
  Filled 2016-12-26: qty 1

## 2016-12-26 MED ORDER — LACTATED RINGERS IV SOLN
INTRAVENOUS | Status: DC
  Administered 2016-12-26 (×2): via INTRAVENOUS

## 2016-12-26 MED ORDER — FENTANYL CITRATE (PF) 100 MCG/2ML IJ SOLN: 50.0000 ug | Freq: Once | INTRAMUSCULAR | Status: DC

## 2016-12-26 MED ORDER — SODIUM CHLORIDE 0.9 % IV SOLN
INTRAVENOUS | Status: DC
  Administered 2016-12-27: 03:00:00 via INTRAVENOUS

## 2016-12-26 MED ORDER — ONDANSETRON HCL 4 MG/2ML IJ SOLN: 4.0000 mg | Freq: Four times a day (QID) | INTRAMUSCULAR | Status: DC | PRN

## 2016-12-26 MED ORDER — FENTANYL CITRATE (PF) 100 MCG/2ML IJ SOLN
100.0000 ug | Freq: Once | INTRAMUSCULAR | Status: AC
Start: 2016-12-26 — End: 2016-12-26
  Administered 2016-12-26: 100 ug via INTRAVENOUS

## 2016-12-26 MED ORDER — METOCLOPRAMIDE HCL 5 MG/ML IJ SOLN: 5.0000 mg | Freq: Three times a day (TID) | INTRAMUSCULAR | Status: DC | PRN

## 2016-12-26 MED ORDER — LIDOCAINE HCL (CARDIAC) 20 MG/ML IV SOLN
INTRAVENOUS | Status: DC | PRN
  Administered 2016-12-26: 60 mg via INTRAVENOUS

## 2016-12-26 MED ORDER — DEXAMETHASONE SODIUM PHOSPHATE 10 MG/ML IJ SOLN
INTRAMUSCULAR | Status: AC
  Filled 2016-12-26: qty 1

## 2016-12-26 MED ORDER — MIDAZOLAM HCL 2 MG/2ML IJ SOLN
INTRAMUSCULAR | Status: AC
  Administered 2016-12-26: 2 mg via INTRAVENOUS
  Filled 2016-12-26: qty 2

## 2016-12-26 MED ORDER — MAGNESIUM CITRATE PO SOLN: 1.0000 | Freq: Once | ORAL | Status: DC | PRN

## 2016-12-26 MED ORDER — ACETAMINOPHEN 325 MG PO TABS
650.0000 mg | ORAL_TABLET | Freq: Four times a day (QID) | ORAL | Status: DC | PRN
  Administered 2016-12-27: 650 mg via ORAL
  Filled 2016-12-26: qty 2

## 2016-12-26 MED ORDER — ROCURONIUM BROMIDE 10 MG/ML (PF) SYRINGE
PREFILLED_SYRINGE | INTRAVENOUS | Status: AC
  Filled 2016-12-26: qty 5

## 2016-12-26 MED ORDER — CEFAZOLIN SODIUM-DEXTROSE 2-4 GM/100ML-% IV SOLN
INTRAVENOUS | Status: AC
  Filled 2016-12-26: qty 100

## 2016-12-26 MED ORDER — FENTANYL CITRATE (PF) 100 MCG/2ML IJ SOLN
INTRAMUSCULAR | Status: DC | PRN
  Administered 2016-12-26: 150 ug via INTRAVENOUS

## 2016-12-26 MED ORDER — ONDANSETRON HCL 4 MG/2ML IJ SOLN
INTRAMUSCULAR | Status: AC
  Filled 2016-12-26: qty 2

## 2016-12-26 MED ORDER — 0.9 % SODIUM CHLORIDE (POUR BTL) OPTIME
TOPICAL | Status: DC | PRN
  Administered 2016-12-26: 1000 mL

## 2016-12-26 MED ORDER — HYDROMORPHONE HCL 1 MG/ML IJ SOLN
INTRAMUSCULAR | Status: AC
  Filled 2016-12-26: qty 1

## 2016-12-26 MED ORDER — DOCUSATE SODIUM 100 MG PO CAPS
100.0000 mg | ORAL_CAPSULE | Freq: Two times a day (BID) | ORAL | Status: DC
  Administered 2016-12-26 – 2016-12-27 (×2): 100 mg via ORAL
  Filled 2016-12-26 (×2): qty 1

## 2016-12-26 MED ORDER — MIDAZOLAM HCL 5 MG/5ML IJ SOLN
INTRAMUSCULAR | Status: DC | PRN
  Administered 2016-12-26: 2 mg via INTRAVENOUS

## 2016-12-26 MED ORDER — MIDAZOLAM HCL 2 MG/2ML IJ SOLN
2.0000 mg | Freq: Once | INTRAMUSCULAR | Status: AC
  Administered 2016-12-26: 2 mg via INTRAVENOUS

## 2016-12-26 MED ORDER — POLYETHYLENE GLYCOL 3350 17 G PO PACK: 17.0000 g | PACK | Freq: Every day | ORAL | Status: DC | PRN

## 2016-12-26 MED ORDER — OXYCODONE HCL 5 MG PO TABS
5.0000 mg | ORAL_TABLET | ORAL | Status: DC | PRN
  Administered 2016-12-27: 10 mg via ORAL
  Filled 2016-12-26: qty 2

## 2016-12-26 MED ORDER — BUPIVACAINE-EPINEPHRINE (PF) 0.25% -1:200000 IJ SOLN
INTRAMUSCULAR | Status: DC | PRN
  Administered 2016-12-26 (×2): 30 mL via PERINEURAL

## 2016-12-26 MED ORDER — ONDANSETRON HCL 4 MG/2ML IJ SOLN
INTRAMUSCULAR | Status: DC | PRN
  Administered 2016-12-26: 4 mg via INTRAVENOUS

## 2016-12-26 MED ORDER — ONDANSETRON HCL 4 MG PO TABS: 4.0000 mg | ORAL_TABLET | Freq: Four times a day (QID) | ORAL | Status: DC | PRN

## 2016-12-26 MED ORDER — DEXAMETHASONE SODIUM PHOSPHATE 10 MG/ML IJ SOLN
INTRAMUSCULAR | Status: DC | PRN
  Administered 2016-12-26: 10 mg via INTRAVENOUS

## 2016-12-26 MED ORDER — DEXTROSE 5 % IV SOLN
500.0000 mg | Freq: Four times a day (QID) | INTRAVENOUS | Status: DC | PRN
  Filled 2016-12-26: qty 5

## 2016-12-26 SURGICAL SUPPLY — 75 items
APL SKNCLS STERI-STRIP NONHPOA (GAUZE/BANDAGES/DRESSINGS) ×1
BANDAGE ACE 4X5 VEL STRL LF (GAUZE/BANDAGES/DRESSINGS) IMPLANT
BANDAGE ACE 6X5 VEL STRL LF (GAUZE/BANDAGES/DRESSINGS) IMPLANT
BANDAGE ESMARK 6X9 LF (GAUZE/BANDAGES/DRESSINGS) IMPLANT
BENZOIN TINCTURE PRP APPL 2/3 (GAUZE/BANDAGES/DRESSINGS) ×2 IMPLANT
BIT DRILL 2.0 (BIT) ×2
BIT DRILL 2.0MM (BIT) ×1
BIT DRILL 2XNS DISP SS SM FRAG (BIT) IMPLANT
BIT DRILL LCP QC 2X140 (BIT) ×2 IMPLANT
BIT DRL 2XNS DISP SS SM FRAG (BIT) ×1
BNDG CMPR 9X6 STRL LF SNTH (GAUZE/BANDAGES/DRESSINGS) ×1
BNDG COHESIVE 6X5 TAN STRL LF (GAUZE/BANDAGES/DRESSINGS) ×6 IMPLANT
BNDG ESMARK 6X9 LF (GAUZE/BANDAGES/DRESSINGS) ×3
BNDG GAUZE ELAST 4 BULKY (GAUZE/BANDAGES/DRESSINGS) ×4 IMPLANT
BNDG GAUZE STRTCH 6 (GAUZE/BANDAGES/DRESSINGS) ×9 IMPLANT
COTTON STERILE ROLL (GAUZE/BANDAGES/DRESSINGS) ×1 IMPLANT
COVER SURGICAL LIGHT HANDLE (MISCELLANEOUS) ×8 IMPLANT
CUFF TOURNIQUET SINGLE 34IN LL (TOURNIQUET CUFF) IMPLANT
CUFF TOURNIQUET SINGLE 44IN (TOURNIQUET CUFF) IMPLANT
DRAPE HALF SHEET 40X57 (DRAPES) ×5 IMPLANT
DRAPE INCISE IOBAN 66X45 STRL (DRAPES) ×3 IMPLANT
DRAPE OEC MINIVIEW 54X84 (DRAPES) ×3 IMPLANT
DRAPE U-SHAPE 47X51 STRL (DRAPES) ×9 IMPLANT
DRESSING PREVENA PLUS CUSTOM (GAUZE/BANDAGES/DRESSINGS) IMPLANT
DRSG ADAPTIC 3X8 NADH LF (GAUZE/BANDAGES/DRESSINGS) ×5 IMPLANT
DRSG PREVENA PLUS CUSTOM (GAUZE/BANDAGES/DRESSINGS) ×3
DURAPREP 26ML APPLICATOR (WOUND CARE) ×5 IMPLANT
ELECT REM PT RETURN 9FT ADLT (ELECTROSURGICAL) ×3
ELECTRODE REM PT RTRN 9FT ADLT (ELECTROSURGICAL) ×1 IMPLANT
GAUZE SPONGE 4X4 12PLY STRL (GAUZE/BANDAGES/DRESSINGS) ×5 IMPLANT
GAUZE SPONGE 4X4 12PLY STRL LF (GAUZE/BANDAGES/DRESSINGS) ×4 IMPLANT
GLOVE BIOGEL PI IND STRL 7.5 (GLOVE) IMPLANT
GLOVE BIOGEL PI IND STRL 9 (GLOVE) ×1 IMPLANT
GLOVE BIOGEL PI INDICATOR 7.5 (GLOVE) ×8
GLOVE BIOGEL PI INDICATOR 9 (GLOVE) ×6
GLOVE SURG ORTHO 9.0 STRL STRW (GLOVE) ×7 IMPLANT
GLOVE SURG SS PI 6.5 STRL IVOR (GLOVE) ×8 IMPLANT
GOWN STRL REUS W/ TWL XL LVL3 (GOWN DISPOSABLE) ×3 IMPLANT
GOWN STRL REUS W/TWL XL LVL3 (GOWN DISPOSABLE) ×9
GUIDEWIRE THREADED 2.8 (WIRE) ×2 IMPLANT
JOYSTICK REDUCTION 5.0 (MISCELLANEOUS) ×2 IMPLANT
KIT BASIN OR (CUSTOM PROCEDURE TRAY) ×3 IMPLANT
KIT ROOM TURNOVER OR (KITS) ×3 IMPLANT
MANIFOLD NEPTUNE II (INSTRUMENTS) ×1 IMPLANT
NS IRRIG 1000ML POUR BTL (IV SOLUTION) ×3 IMPLANT
PACK ORTHO EXTREMITY (CUSTOM PROCEDURE TRAY) ×5 IMPLANT
PAD ARMBOARD 7.5X6 YLW CONV (MISCELLANEOUS) ×6 IMPLANT
PADDING CAST COTTON 6X4 STRL (CAST SUPPLIES) ×5 IMPLANT
PLATE CALCANEAL LOCKING 2.7MM (Plate) ×2 IMPLANT
SCREW 6.5X60MM (Screw) ×2 IMPLANT
SCREW LOCK T8 24X2.7XSTVA (Screw) IMPLANT
SCREW LOCK VA ST 2.7X26 (Screw) ×4 IMPLANT
SCREW LOCKING 2.7X24MM (Screw) ×3 IMPLANT
SCREW LOCKING 2.7X28 (Screw) ×4 IMPLANT
SCREW LOCKING VA 2.7X40MM (Screw) ×2 IMPLANT
SCREW METAPHYSEAL 2.7X24MM (Screw) ×4 IMPLANT
SCREW SELF-TAP 2.7X26MM (Screw) ×2 IMPLANT
SPONGE LAP 18X18 X RAY DECT (DISPOSABLE) ×5 IMPLANT
STAPLER VISISTAT 35W (STAPLE) IMPLANT
STOCKINETTE 6  STRL (DRAPES) ×4
STOCKINETTE 6 STRL (DRAPES) IMPLANT
SUCTION FRAZIER HANDLE 10FR (MISCELLANEOUS) ×2
SUCTION FRAZIER TIP 10 FR DISP (SUCTIONS) ×2 IMPLANT
SUCTION TUBE FRAZIER 10FR DISP (MISCELLANEOUS) ×1 IMPLANT
SUT ETHILON 2 0 FS 18 (SUTURE) ×6 IMPLANT
SUT ETHILON 2 0 PSLX (SUTURE) IMPLANT
SUT VIC AB 2-0 CT1 27 (SUTURE) ×6
SUT VIC AB 2-0 CT1 TAPERPNT 27 (SUTURE) IMPLANT
SUT VIC AB 2-0 CTB1 (SUTURE) ×6 IMPLANT
TOWEL OR 17X24 6PK STRL BLUE (TOWEL DISPOSABLE) ×5 IMPLANT
TOWEL OR 17X26 10 PK STRL BLUE (TOWEL DISPOSABLE) ×3 IMPLANT
TUBE CONNECTING 12'X1/4 (SUCTIONS) ×1
TUBE CONNECTING 12X1/4 (SUCTIONS) ×2 IMPLANT
WATER STERILE IRR 1000ML POUR (IV SOLUTION) ×1 IMPLANT
YANKAUER SUCT BULB TIP NO VENT (SUCTIONS) ×4 IMPLANT

## 2016-12-26 NOTE — Anesthesia Preprocedure Evaluation (Signed)
Anesthesia Evaluation  Patient identified by MRN, date of birth, ID band Patient awake    Reviewed: Allergy & Precautions, NPO status , Patient's Chart, lab work & pertinent test results  Airway Mallampati: I  TM Distance: >3 FB Neck ROM: Full    Dental  (+) Edentulous Upper, Edentulous Lower   Pulmonary neg pulmonary ROS, Current Smoker,    breath sounds clear to auscultation       Cardiovascular negative cardio ROS   Rhythm:Regular Rate:Normal     Neuro/Psych PSYCHIATRIC DISORDERS Bipolar Disorder negative neurological ROS     GI/Hepatic negative GI ROS, Neg liver ROS,   Endo/Other  negative endocrine ROS  Renal/GU negative Renal ROS     Musculoskeletal negative musculoskeletal ROS (+)   Abdominal   Peds  Hematology negative hematology ROS (+)   Anesthesia Other Findings Day of surgery medications reviewed with the patient.  Reproductive/Obstetrics                             Anesthesia Physical Anesthesia Plan  ASA: II  Anesthesia Plan: General   Post-op Pain Management:  Regional for Post-op pain   Induction: Intravenous  PONV Risk Score and Plan: 2 and Ondansetron and Dexamethasone  Airway Management Planned: LMA  Additional Equipment:   Intra-op Plan:   Post-operative Plan: Extubation in OR  Informed Consent: I have reviewed the patients History and Physical, chart, labs and discussed the procedure including the risks, benefits and alternatives for the proposed anesthesia with the patient or authorized representative who has indicated his/her understanding and acceptance.   Dental advisory given  Plan Discussed with: CRNA  Anesthesia Plan Comments:         Anesthesia Quick Evaluation

## 2016-12-26 NOTE — Op Note (Signed)
12/26/2016  7:36 PM  PATIENT:  [name]    PRE-OPERATIVE DIAGNOSIS:  Bilateral Calcaneus Fractures  POST-OPERATIVE DIAGNOSIS:  Same  PROCEDURE:  OPEN REDUCTION INTERNAL FIXATION (ORIF) BILATERAL CALCANEOUS FRACTURE Prevena plus wound VAC. C-arm fluoroscopy.  SURGEON:  Nadara Mustard, MD  PHYSICIAN ASSISTANT:None ANESTHESIA:   General  PREOPERATIVE INDICATIONS:  [name] is a  31 y.o. male with a diagnosis of Bilateral Calcaneus Fractures who failed conservative measures and elected for surgical management.    The risks benefits and alternatives were discussed with the patient preoperatively including but not limited to the risks of infection, bleeding, nerve injury, cardiopulmonary complications, the need for revision surgery, among others, and the patient was willing to proceed.  OPERATIVE IMPLANTS: Synthes medium calcaneal plate left, 6.5 headless screw right  OPERATIVE FINDINGS: Intact posterior facet right, comminuted posterior facet left.  OPERATIVE PROCEDURE: Patient was brought to the operating room after undergoing bilateral popliteal blocks. After adequate levels anesthesia were obtained patient was placed in the left lateral decubitus position with the right side up and the right lower extremity was prepped using DuraPrep draped into a sterile field a timeout was called. A incision was made posteriorly over the calcaneus. The reduction shave and some pin was placed into the calcaneus with manipulation the posterior facet was reduced and the Bohler's angle was restored on the calcaneus. After reduction a guidewire was placed from the posterior os calcis into the anterior fragment and a 6.5 headless screw was used to stabilize the calcaneal fracture. C-arm fluoroscopy verified alignment. The wound was irrigated with normal saline and a sterile dressing was applied. The drapes were removed the patient was turned to the right lateral decubitus position with the left  side up and the left lower extremity was prepped using DuraPrep and draped into a sterile field. A timeout was called for a second time. An extensile incision was made over the calcaneus the flap was elevated including the peroneal tendons. Guidewires were placed into the fibula and the talar neck this was used to elevate the flap. The lateral wall fragment was removed and the segmental posterior facet was reduced and stabilized with K wires. The wall was then reapproximated a medial plate was applied laterally and compression screws were used to restore the lateral wall of the calcaneus. Additional locking screws were placed across the posterior facet. C-arm fluoroscopy was used to verify reduction and alignment the medial column length was restored the posterior facet was congruent. The wound was irrigated with normal saline. Subcutaneous is closed using 2-0 Vicryl and an Algower Donati suture technique was used to secure the flap. No sutures crossed the flap. There is no blisters no skin defect. The wound was cleansed prepped using benzoin and a Prevena plus wound VAC was applied. This had a good suction fit patient was extubated taken to the PACU in stable condition.

## 2016-12-26 NOTE — H&P (Signed)
[name] is an 31 y.o. male.   Chief Complaint: Bilateral heel pain HPI: Patient is a 31 year old gentleman who jumped out of a second-story window sustaining bilateral calcaneal fractures.  Past Medical History:  Diagnosis Date  . Full dentures   . Retained orthopedic hardware 08/2015   left ankle  . Runny nose 08/26/2015   clear drainage, per pt.    Past Surgical History:  Procedure Laterality Date  . HARDWARE REMOVAL Left 09/02/2015   Procedure: LEFT ANKLE HARDWARE REMOVAL;  Surgeon: Sheral Apley, MD;  Location: [facility];  Service: Orthopedics;  Laterality: Left;  . ORIF ANKLE FRACTURE Left 05/2015    No family history on file. Social History:  reports that he has been smoking Cigarettes.  He has a 10.00 pack-year smoking history. He has never used smokeless tobacco. He reports that he drinks alcohol. He reports that he does not use drugs.  Allergies: No Known Allergies  No prescriptions prior to admission.    No results found for this or any previous visit (from the past 48 hour(s)). No results found.  Review of Systems  All other systems reviewed and are negative.   There were no vitals taken for this visit. Physical Exam  Examination patient is alert oriented no adenopathy well-dressed normal affect normal respiratory effort he has an antalgic gait. Patient is currently in [place] jumpsuit with handcuffs he is under police custody. Examination patient has a good dorsalis pedis pulse bilaterally On both feet. He has ecchymosis and bruising around the foot and hindfoot but has no skin breakdown and no blisters. 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 Assessment/Plan Assessment: Bilateral calcaneal fractures closed.  Plan: We'll plan for open reduction internal fixation bilateral calcaneal fractures. Risk and benefits were discussed including infection  neurovascular injury nonhealing of the wound arthritis pain and need for additional surgery potential for amputation on the left foot due to previous surgical interventions. Patient states he understands wish to proceed at this time.  Frank Mustard, MD 12/26/2016, 6:56 AM

## 2016-12-26 NOTE — Transfer of Care (Signed)
Immediate Anesthesia Transfer of Care Note  Patient: [name]  Procedure(s) Performed: Procedure(s): OPEN REDUCTION INTERNAL FIXATION (ORIF) BILATERAL CALCANEOUS FRACTURE (Bilateral)  Patient Location: PACU  Anesthesia Type:General  Level of Consciousness: awake, alert  and oriented  Airway & Oxygen Therapy: Patient Spontanous Breathing  Post-op Assessment: Report given to RN and Post -op Vital signs reviewed and stable  Post vital signs: Reviewed and stable  Last Vitals:  Vitals:   12/26/16 1445 12/26/16 1936  BP: 121/67 (!) (P) 144/61  Pulse: 91   Resp: 18 17  Temp:  (!) (P) 36.3 C  SpO2: 100% 100%    Last Pain:  Vitals:   12/26/16 1936  TempSrc:   PainSc: (P) 0-No pain         Complications: No apparent anesthesia complications

## 2016-12-26 NOTE — Progress Notes (Signed)
Orthopedic Tech Progress Note Patient Details:  [name] 1985-12-05 [id number]  Ortho Devices Type of Ortho Device: CAM walker Ortho Device/Splint Location: (B) LE Ortho Device/Splint Interventions: [name] 12/26/2016, 9:18 PM

## 2016-12-26 NOTE — Anesthesia Procedure Notes (Addendum)
Anesthesia Regional Block: Popliteal block   Pre-Anesthetic Checklist: ,, timeout performed, Correct Patient, Correct Site, Correct Laterality, Correct Procedure, Correct Position, site marked, Risks and benefits discussed,  Surgical consent,  Pre-op evaluation,  At surgeon's request and post-op pain management  Laterality: Left  Prep: chloraprep       Needles:  Injection technique: Single-shot  Needle Type: Echogenic Needle     Needle Length: 9cm  Needle Gauge: 21     Additional Needles:   Procedures:,,,, ultrasound used (permanent image in chart),,,,  Narrative:  Start time: 12/26/2016 2:20 PM End time: 12/26/2016 2:30 PM Injection made incrementally with aspirations every 5 mL.  Performed by: Personally  Anesthesiologist: Shona [facility] D  Additional Notes: Tolerated well. No issues.

## 2016-12-26 NOTE — Anesthesia Procedure Notes (Signed)
Procedure Name: LMA Insertion Date/Time: 12/26/2016 6:01 PM Performed by: Orvilla Fus A Pre-anesthesia Checklist: Patient identified, Emergency Drugs available, Suction available and Patient being monitored Patient Re-evaluated:Patient Re-evaluated prior to induction Oxygen Delivery Method: Circle System Utilized Preoxygenation: Pre-oxygenation with 100% oxygen Induction Type: IV induction Ventilation: Mask ventilation without difficulty LMA: LMA inserted LMA Size: 4.0 Number of attempts: 1 Airway Equipment and Method: Bite block Placement Confirmation: positive ETCO2 Tube secured with: Tape Dental Injury: Teeth and Oropharynx as per pre-operative assessment

## 2016-12-26 NOTE — Anesthesia Procedure Notes (Signed)
Anesthesia Regional Block: Popliteal block   Pre-Anesthetic Checklist: ,, timeout performed, Correct Patient, Correct Site, Correct Laterality, Correct Procedure, Correct Position, site marked, Risks and benefits discussed,  Surgical consent,  Pre-op evaluation,  At surgeon's request and post-op pain management  Laterality: Right  Prep: chloraprep       Needles:  Injection technique: Single-shot  Needle Type: Echogenic Needle     Needle Length: 9cm  Needle Gauge: 21     Additional Needles:   Procedures:,,,, ultrasound used (permanent image in chart),,,,  Narrative:  Start time: 12/26/2016 2:30 PM End time: 12/26/2016 2:40 PM Injection made incrementally with aspirations every 5 mL.  Performed by: Personally  Anesthesiologist: Shona [facility] D  Additional Notes: Tolerated well. No issues.

## 2016-12-26 NOTE — Anesthesia Postprocedure Evaluation (Signed)
Anesthesia Post Note  Patient: [name]  Procedure(s) Performed: Procedure(s) (LRB): OPEN REDUCTION INTERNAL FIXATION (ORIF) BILATERAL CALCANEOUS FRACTURE (Bilateral)     Patient location during evaluation: PACU Anesthesia Type: General and Regional Level of consciousness: awake and alert Pain management: pain level controlled Vital Signs Assessment: post-procedure vital signs reviewed and stable Respiratory status: spontaneous breathing, nonlabored ventilation and respiratory function stable Cardiovascular status: blood pressure returned to baseline and stable Postop Assessment: no apparent nausea or vomiting Anesthetic complications: no    Last Vitals:  Vitals:   12/26/16 2017 12/26/16 2020  BP:  112/67  Pulse: 75 71  Resp:  16  Temp:    SpO2: 100% 100%    Last Pain:  Vitals:   12/26/16 2017  TempSrc:   PainSc: 4     LLE Motor Response: No movement due to regional block (12/26/16 2017) LLE Sensation: Numbness (12/26/16 2017) RLE Motor Response: No movement due to regional block (12/26/16 2017) RLE Sensation: Numbness (12/26/16 2017)      Garnet Overfield,W. EDMOND

## 2016-12-27 ENCOUNTER — Encounter (HOSPITAL_COMMUNITY): Payer: Self-pay | Admitting: General Practice

## 2016-12-27 ENCOUNTER — Telehealth (INDEPENDENT_AMBULATORY_CARE_PROVIDER_SITE_OTHER): Payer: Self-pay | Admitting: Orthopedic Surgery

## 2016-12-27 DIAGNOSIS — S92002A Unspecified fracture of left calcaneus, initial encounter for closed fracture: Secondary | ICD-10-CM | POA: Diagnosis not present

## 2016-12-27 MED ORDER — OXYCODONE-ACETAMINOPHEN 5-325 MG PO TABS: 1.0000 | ORAL_TABLET | ORAL | 0 refills | Status: AC | PRN

## 2016-12-27 NOTE — Progress Notes (Signed)
Discharge instructions and RX reviewed with pt verb understanding. Pt left via police custody to return to jail. Pt left via wheelchair with two cam walker boots, and wound vac in place. NAd noted at time of discharge.

## 2016-12-27 NOTE — Discharge Summary (Signed)
Discharge Diagnoses:  Active Problems:   Bilateral calcaneal fractures   Surgeries: Procedure(s): OPEN REDUCTION INTERNAL FIXATION (ORIF) BILATERAL CALCANEOUS FRACTURE on 12/26/2016    Consultants:   Discharged Condition: Improved  Hospital Course: [name] is an 31 y.o. male who was admitted 12/26/2016 with a chief complaint of closed bilateral calcaneal fractures, with a final diagnosis of Bilateral Calcaneus Fractures.  Patient was brought to the operating room on 12/26/2016 and underwent Procedure(s): OPEN REDUCTION INTERNAL FIXATION (ORIF) BILATERAL CALCANEOUS FRACTURE.    Patient was given perioperative antibiotics: Anti-infectives    Start     Dose/Rate Route Frequency Ordered Stop   12/26/16 2100  ceFAZolin (ANCEF) IVPB 1 g/50 mL premix     1 g 100 mL/hr over 30 Minutes Intravenous Every 6 hours 12/26/16 2055 12/27/16 1459   12/26/16 1415  ceFAZolin (ANCEF) IVPB 2g/100 mL premix  Status:  Discontinued     2 g 200 mL/hr over 30 Minutes Intravenous On call to O.R. 12/26/16 1352 12/26/16 2043   12/26/16 1358  ceFAZolin (ANCEF) 2-4 GM/100ML-% IVPB    Comments:  Schonewitz, Leigh   : cabinet override      12/26/16 1358 12/27/16 0159    .  Patient was given sequential compression devices, early ambulation, and aspirin for DVT prophylaxis.  Recent vital signs: Patient Vitals for the past 24 hrs:  BP Temp Temp src Pulse Resp SpO2 Height Weight  12/27/16 0527 (!) 103/57 98.5 F (36.9 C) Oral 64 17 100 % - -  12/27/16 0100 106/67 98.9 F (37.2 C) Oral 99 18 100 % - -  12/26/16 2052 (!) 133/93 98.3 F (36.8 C) Oral 90 18 100 % - -  12/26/16 2034 113/62 (!) 97.4 F (36.3 C) - 73 17 100 % - -  12/26/16 2020 112/67 - - 71 16 100 % - -  12/26/16 2017 - - - 75 - 100 % - -  12/26/16 2007 120/72 - - 68 20 100 % - -  12/26/16 1950 121/64 - - 91 16 100 % - -  12/26/16 1936 (!) 144/61 (!) 97.3 F (36.3 C) - - 17 100 % - -  12/26/16 1445 121/67 - - 91 18 100 % - -  12/26/16  1440 107/61 - - 71 20 100 % - -  12/26/16 1435 (!) 125/99 - - 65 (!) 27 (!) 87 % - -  12/26/16 1430 118/65 - - 86 17 100 % - -  12/26/16 1425 110/63 - - 71 (!) 8 100 % - -  12/26/16 1352 131/63 98.5 F (36.9 C) Oral 78 18 100 %  (1.651 m) 140 lb (63.5 kg)  .  Recent laboratory studies: No results found.  Discharge Medications:   Allergies as of 12/27/2016   No Known Allergies     Medication List    TAKE these medications   HYDROcodone-acetaminophen 5-325 MG tablet Commonly known as:  NORCO Take 1-2 tablets by mouth every 4 (four) hours as needed for moderate pain or severe pain.   oxyCODONE-acetaminophen 5-325 MG tablet Commonly known as:  PERCOCET/ROXICET Take 2 tablets by mouth every 4 (four) hours as needed. What changed:  Another medication with the same name was added. Make sure you understand how and when to take each.   oxyCODONE-acetaminophen 5-325 MG tablet Commonly known as:  ROXICET Take 1 tablet by mouth every 4 (four) hours as needed for severe pain. What changed:  You were already taking a medication with  the same name, and this prescription was added. Make sure you understand how and when to take each.            Discharge Care Instructions        Start     Ordered   12/27/16 0000  Call MD / Call 911    Comments:  If you experience chest pain or shortness of breath, CALL 911 and be transported to the hospital emergency room.  If you develope a fever above 101 F, pus (white drainage) or increased drainage or redness at the wound, or calf pain, call your surgeon's office.   12/27/16 0646   12/27/16 0000  Diet - low sodium heart healthy     12/27/16 0646   12/27/16 0000  Constipation Prevention    Comments:  Drink plenty of fluids.  Prune juice may be helpful.  You may use a stool softener, such as Colace (over the counter) 100 mg twice a day.  Use MiraLax (over the counter) for constipation as needed.   12/27/16 0646   12/27/16 0000  Increase  activity slowly as tolerated     12/27/16 0646   12/27/16 0000  oxyCODONE-acetaminophen (ROXICET) 5-325 MG tablet  Every 4 hours PRN     12/27/16 0646   12/27/16 0000  Elevate operative extremity     12/27/16 0646   12/27/16 0000  Touch down weight bearing    Question Answer Comment  Laterality bilateral   Extremity Lower      12/27/16 0646   12/27/16 0000  Ice pack     12/27/16 0646      Diagnostic Studies: Dg Lumbar Spine Complete  Result Date: 12/18/2016 CLINICAL DATA:  Initial evaluation for acute trauma, 12 foot jump. EXAM: LUMBAR SPINE - COMPLETE 4+ VIEW COMPARISON:  None. FINDINGS: There is no evidence of lumbar spine fracture. Mild dextroscoliosis, which may in part be positional. Degenerative intervertebral disc space narrowing at L5-S1. Disc spaces otherwise maintained. IMPRESSION: No radiographic evidence for acute abnormality within the lumbar spine. Electronically Signed   By: Rise Mu M.D.   On: 12/18/2016 22:42   Dg Pelvis 1-2 Views  Result Date: 12/18/2016 CLINICAL DATA:  Initial evaluation for acute trauma, 12 put John. EXAM: PELVIS - 1-2 VIEW COMPARISON:  None. FINDINGS: There is no evidence of pelvic fracture or diastasis. No pelvic bone lesions are seen. IMPRESSION: Negative. Electronically Signed   By: Rise Mu M.D.   On: 12/18/2016 22:33   Dg Tibia/fibula Left  Result Date: 12/19/2016 CLINICAL DATA:  Pain to the distal tibia and fibula after 10 foot jump EXAM: LEFT TIBIA AND FIBULA - 2 VIEW COMPARISON:  12/18/2016 FINDINGS: Oblique lucency through the distal shaft and metaphysis of the fibula. Margins are somewhat sclerotic. Comminuted calcaneal fracture. IMPRESSION: 1. Fracture lucency through the distal tibia, margins somewhat sclerotic, suggesting possible ununited fracture, but no previous images for comparison 2. Comminuted calcaneal fracture Electronically Signed   By: Jasmine Pang M.D.   On: 12/19/2016 00:27   Dg Os Calcis  Left  Result Date: 12/18/2016 CLINICAL DATA:  Jumping injury, trauma, pain EXAM: LEFT OS CALCIS - 2+ VIEW COMPARISON:  12/18/2016 FINDINGS: Acute comminuted diffuse left calcaneal fracture without significant displacement or flattening. Fracture does appear to involve the articular surface at the subtalar joint. Preservation of Bohler's angle roughly measuring 40 degrees. IMPRESSION: Diffuse acute and comminuted left calcaneus fracture. Electronically Signed   By: Judie Petit.  Shick M.D.   On: 12/18/2016 22:35  Dg Os Calcis Right  Result Date: 12/18/2016 CLINICAL DATA:  Initial evaluation for acute trauma, fall foot jumped. EXAM: RIGHT OS CALCIS - 2+ VIEW COMPARISON:  None. FINDINGS: Acute comminuted minimally displaced fracture of the posterior calcaneus. Subtalar joint remains grossly approximated. No appreciable soft tissue injury. IMPRESSION: Acute comminuted minimally displaced fracture of the posterior calcaneus. Electronically Signed   By: Rise Mu M.D.   On: 12/18/2016 22:32   Ct Ankle Right Wo Contrast  Result Date: 12/19/2016 CLINICAL DATA:  Jumped from second floor, bilateral ankle pain EXAM: CT OF THE RIGHT ANKLE WITHOUT CONTRAST TECHNIQUE: Multidetector CT imaging of the right ankle was performed according to the standard protocol. Multiplanar CT image reconstructions were also generated. COMPARISON:  Radiographs 12/18/2016 FINDINGS: Bones/Joint/Cartilage Motion degradation. Markedly comminuted fracture involving the mid and posterior aspect of the calcaneus bone. Extension of fracture lucency to the posterior talocalcaneal joint and the middle talocalcaneal joint. No fracture extension to the anterior process of the calcaneus. No dislocation evident. No definite fracture involving the talus bone, or distal tibia or fibula. Mortise grossly symmetric. Ligaments Suboptimally assessed by CT. Muscles and Tendons Muscular edema present.  Achilles tendon grossly normal in position Soft tissues  Large amount of soft tissue swelling over the dorsal, lateral aspect of the foot and ankle. IMPRESSION: Markedly comminuted calcaneal fracture involving the mid and posterior aspect of calcaneus bone with no definitive fracture lucency extension to the anterior calcaneus process. Extension of fracture lucency to the middle and posterior talocalcaneal joints. Electronically Signed   By: Jasmine Pang M.D.   On: 12/19/2016 01:04   Ct Ankle Left Wo Contrast  Result Date: 12/19/2016 CLINICAL DATA:  Calcaneal fracture EXAM: CT OF THE LEFT ANKLE WITHOUT CONTRAST TECHNIQUE: Multidetector CT imaging of the left ankle was performed according to the standard protocol. Multiplanar CT image reconstructions were also generated. COMPARISON:  12/18/2016 radiograph FINDINGS: Bones/Joint/Cartilage Severely comminuted calcaneus fracture involving the anterior, mid and posterior aspect of calcaneus bone with separation and slight splaying of the multiple fracture fragments. Anterior to posterior oriented fracture lucency extends to the posterior talocalcaneal joint, the joint appears widened. Fracture versus os posterior to the talus. Tiny bone fragments on the medial and lateral, inferior aspect of the talus which may represent small displaced cortical fracture fragments. Fracture lucency does not appear to extend to the middle talocalcaneal joint. Fracture on the lateral side of the anterior process of the calcaneus. Extension of fracture lucency to the calcaneal cuboidal joint. Old hardware defects in the distal tibia. Old appearing fracture deformity of the distal fibula with evidence of prior hardware in the fibular malleolus. Ligaments Suboptimally assessed by CT. Muscles and Tendons Achilles tendon grossly normal in position. Soft tissues Marked soft tissue swelling laterally and over the dorsum of the foot and ankle IMPRESSION: Severely comminuted diffuse calcaneal fracture with separation of fracture fragments and  extension of fracture lucency to the posterior talocalcaneal joint and the calcaneal cuboidal joint. Posterior talocalcaneal joint appears widened. Fracture appears to involve the anterior process of the calcaneus bone. Possible small cortical fracture fragments arising from the inferior talus on the medial and lateral side. Old fracture deformity of the distal fibula with evidence of prior hardware. Electronically Signed   By: Jasmine Pang M.D.   On: 12/19/2016 01:20   Dg Foot Complete Left  Result Date: 12/18/2016 CLINICAL DATA:  Initial evaluation for acute trauma, 12 foot show. EXAM: LEFT FOOT - COMPLETE 3+ VIEW COMPARISON:  None. FINDINGS: Acute comminuted  mildly displaced fracture of the calcaneus. Intra-articular extension into the subtalar joint. Associated acute oblique fracture of the distal left fibula. Distal tibia grossly intact. Question subtle cortical irregularity at the left second metatarsal head, which may reflect an acute nondisplaced fracture, not entirely certain. No other acute osseous abnormality about the foot. Soft tissue swelling and present about the ankle and hindfoot. IMPRESSION: 1. Acute comminuted fracture of the left calcaneus with intra-articular extension into the subtalar joint. 2. Acute oblique fracture of the distal left fibula. 3. Question subtle cortical irregularity at the left second metatarsal head, which may reflect an acute nondisplaced fracture, not entirely certain. Correlation with physical exam recommended. Electronically Signed   By: Rise Mu M.D.   On: 12/18/2016 22:36   Dg Foot Complete Right  Result Date: 12/18/2016 CLINICAL DATA:  Initial evaluation for acute trauma, twelfth with Jonny Ruiz. EXAM: RIGHT FOOT COMPLETE - 3+ VIEW COMPARISON:  None. FINDINGS: Acute comminuted fracture of the right calcaneus. Suspected intra-articular extension into the subtalar joint. No other acute fracture dislocation. Soft tissue swelling present about the hindfoot  parent IMPRESSION: Acute comminuted fracture of the right calcaneus. Suspected intra-articular extension into the subtalar joint. Electronically Signed   By: Rise Mu M.D.   On: 12/18/2016 22:39    Patient benefited maximally from their hospital stay and there were no complications.     Disposition: 01-Home or Self Care Discharge Instructions    Call MD / Call 911    Complete by:  As directed    If you experience chest pain or shortness of breath, CALL 911 and be transported to the hospital emergency room.  If you develope a fever above 101 F, pus (white drainage) or increased drainage or redness at the wound, or calf pain, call your surgeon's office.   Constipation Prevention    Complete by:  As directed    Drink plenty of fluids.  Prune juice may be helpful.  You may use a stool softener, such as Colace (over the counter) 100 mg twice a day.  Use MiraLax (over the counter) for constipation as needed.   Diet - low sodium heart healthy    Complete by:  As directed    Elevate operative extremity    Complete by:  As directed    Ice pack    Complete by:  As directed    Increase activity slowly as tolerated    Complete by:  As directed    Touch down weight bearing    Complete by:  As directed    Laterality:  bilateral   Extremity:  Lower   Patient may be weightbearing as tolerated bilateral lower extremities with the fracture boots on for transfers no gait training no ambulation. Ambulate with a wheelchair.     Follow-up Information    Nadara Mustard, MD Follow up in 1 week(s).   Specialty:  Orthopedic Surgery Contact information: [address] [place] [address] [phone]            Signed: Nadara Mustard 12/27/2016, 6:47 AM

## 2016-12-27 NOTE — Telephone Encounter (Signed)
I called and spoke to Erica. Patient is status post bilateral calcaneus fractures requiring surgical intervention. He had an open reduction internal fixation calcaneous fractures with prevena incisional wound vac. The prevena incisional wound vac is not like a traditional wound vac and is disposable. It is only meant to be worn for up to 7 days and requires no wound vac changes. If the vac is malfunctioning, ensure the battery is charged. If the vac is alarming still, the wound vac can be removed, disposed and a dry dressing can be applied. Otherwise our office staff will removed the wound vac at his first post operative appointment. This was put in order and faxed with operative note as well as told verbally over the phone.  

## 2016-12-27 NOTE — Telephone Encounter (Signed)
If the op notes could be faxed to [facility] for their records. Fax # [phone] Attn: Alcario Drought

## 2016-12-27 NOTE — Telephone Encounter (Signed)
I called and spoke to Agenda. Patient is status post bilateral calcaneus fractures requiring surgical intervention. He had an open reduction internal fixation calcaneous fractures with prevena incisional wound vac. The prevena incisional wound vac is not like a traditional wound vac and is disposable. It is only meant to be worn for up to 7 days and requires no wound vac changes. If the vac is malfunctioning, ensure the battery is charged. If the vac is alarming still, the wound vac can be removed, disposed and a dry dressing can be applied. Otherwise our office staff will removed the wound vac at his first post operative appointment. This was put in order and faxed with operative note as well as told verbally over the phone.

## 2016-12-27 NOTE — Telephone Encounter (Signed)
[facility] called asking for instructions for the woundvac that he has on. You can call Erica at [phone] (Before 4) and after 4 at [phone]

## 2016-12-27 NOTE — Evaluation (Signed)
Physical Therapy Evaluation Patient Details Name: [name] MRN: [id number] DOB: 08-Feb-1986 Today's Date: 12/27/2016   History of Present Illness  Pt is a 31 y.o. male admitted after jumping out of a second-story window sustaining bilateral calcaneal fxs. Now s/p bilat calcaneus ORIFs on 12/26/16. Pertinent PMH includes bipolar disorder.    Clinical Impression Patient evaluated by Physical Therapy with no further acute PT needs identified. Pt able to transfer from bed<>manual w/c with good technique, able to lift body with BUE while maintaining BLE TDWB precautions. Reviewed positioning, therex, and importance of mobility. Pt unable to propel w/c in hallway per guard request, but pt reports no issue with doing this upon d/c. All education has been completed and the patient has no further questions. See below for DME recommendations. PT is signing off. Thank you for this referral.    Follow Up Recommendations No PT follow up    Equipment Recommendations  Wheelchair (measurements PT);Wheelchair cushion (measurements PT)    Recommendations for Other Services       Precautions / Restrictions Precautions Precautions: None Required Braces or Orthoses: Other Brace/Splint Other Brace/Splint: Bilat CAM walker boots Restrictions Weight Bearing Restrictions: Yes RLE Weight Bearing: Touchdown weight bearing LLE Weight Bearing: Touchdown weight bearing      Mobility  Bed Mobility Overal bed mobility: Independent                Transfers Overall transfer level: Modified independent   Transfers: Lateral/Scoot Transfers           General transfer comment: Pt able to use BUEs to raise up for seated transfer bed<>manual w/c; educ on BLE TDWB precautions for balance if needed, but pt able to perform transfer while holding BLEs in air. No transfer board needed.  Ambulation/Gait             General Gait Details: Unable  Stairs            Wheelchair Mobility     Modified Rankin (Stroke Patients Only)       Balance Overall balance assessment: Needs assistance Sitting-balance support: Feet unsupported;No upper extremity supported Sitting balance-Leahy Scale: Normal                                       Pertinent Vitals/Pain Pain Assessment: 0-10 Pain Score: 9  Pain Location: L ankle Pain Descriptors / Indicators: Discomfort;Stabbing Pain Intervention(s): Monitored during session;Patient requesting pain meds-RN notified;Ice applied    Home Living Family/patient expects to be discharged to:: [facility] Living Arrangements: Other relatives                    Prior Function Level of Independence: Independent               Hand Dominance        Extremity/Trunk Assessment   Upper Extremity Assessment Upper Extremity Assessment: Overall WFL for tasks assessed    Lower Extremity Assessment Lower Extremity Assessment: RLE deficits/detail;LLE deficits/detail RLE Deficits / Details: s/p R ankle ORIF; R hip flex & knee flex/ext 4/5 RLE: Unable to fully assess due to pain RLE Sensation: decreased light touch (reports R foot numbness since sx) LLE Deficits / Details: s/p L ankle ORIF; L hip flexion & knee flex/ext 4/5 LLE: Unable to fully assess due to pain    Cervical / Trunk Assessment Cervical / Trunk Assessment: Normal  Communication   Communication:  No difficulties  Cognition Arousal/Alertness: Awake/alert Behavior During Therapy: WFL for tasks assessed/performed Overall Cognitive Status: Within Functional Limits for tasks assessed                                        General Comments      Exercises General Exercises - Lower Extremity Long Arc Quad: AROM;Both;10 reps Straight Leg Raises: AROM;Both;10 reps Hip Flexion/Marching: AROM;Both;10 reps   Assessment/Plan    PT Assessment Patent does not need any further PT services  PT Problem List         PT  Treatment Interventions      PT Goals (Current goals can be found in the Care Plan section)  Acute Rehab PT Goals Patient Stated Goal: Decreased pain PT Goal Formulation: With patient Time For Goal Achievement: 01/10/17 Potential to Achieve Goals: Good    Frequency     Barriers to discharge        Co-evaluation               AM-PAC PT "6 Clicks" Daily Activity  Outcome Measure Difficulty turning over in bed (including adjusting bedclothes, sheets and blankets)?: None Difficulty moving from lying on back to sitting on the side of the bed? : None Difficulty sitting down on and standing up from a chair with arms (e.g., wheelchair, bedside commode, etc,.)?: None Help needed moving to and from a bed to chair (including a wheelchair)?: None Help needed walking in hospital room?: Total Help needed climbing 3-5 steps with a railing? : Total 6 Click Score: 18    End of Session Equipment Utilized During Treatment: Gait belt Activity Tolerance: Patient tolerated treatment well Patient left: in bed;with call bell/phone within reach;with nursing/sitter in room (officer in room) Nurse Communication: Mobility status PT Visit Diagnosis: Other abnormalities of gait and mobility (R26.89);Pain Pain - Right/Left: Left Pain - part of body: Ankle and joints of foot    Time: 0922-0939 PT Time Calculation (min) (ACUTE ONLY): 17 min   Charges:   PT Evaluation $PT Eval Low Complexity: 1 Low     PT G Codes:       Ina Homes, PT, DPT Acute Rehab Services  Pager: [phone]  Malachy Chamber 12/27/2016, 10:05 AM

## 2016-12-30 NOTE — ED Provider Notes (Signed)
[facility]-EMERGENCY DEPT Provider Note   CSN: [id number] Arrival date & time: 12/18/16  2042     History   Chief Complaint Chief Complaint  Patient presents with  . Fall    HPI [name] Xxx Crehan is a 31 y.o. male. Chief complaint is 10 foot jump, heel pain  HPI this is a 31 year old male. He is incarcerated. [place]. He landed against feet. He complains of severe bilateral feet and heel pain. Also complains of pelvic pain.  Past Medical History:  Diagnosis Date  . Bipolar disorder (HCC)   . Full dentures   . Retained orthopedic hardware 08/2015   left ankle  . Runny nose 08/26/2015   clear drainage, per pt.    Patient Active Problem List   Diagnosis Date Noted  . Bilateral calcaneal fractures 12/26/2016  . Closed bilateral calcaneal fractures, initial encounter 12/25/2016  . Retained orthopedic hardware 09/02/2015    Past Surgical History:  Procedure Laterality Date  . FRACTURE SURGERY    . HARDWARE REMOVAL Left 09/02/2015   Procedure: LEFT ANKLE HARDWARE REMOVAL;  Surgeon: Sheral Apley, MD;  Location: [facility];  Service: Orthopedics;  Laterality: Left;  . ORIF ANKLE FRACTURE Left 05/2015  . ORIF CALCANEAL FRACTURE Bilateral 12/26/2016  . ORIF CALCANEOUS FRACTURE Bilateral 12/26/2016   Procedure: OPEN REDUCTION INTERNAL FIXATION (ORIF) BILATERAL CALCANEOUS FRACTURE;  Surgeon: Nadara Mustard, MD;  Location: [facility];  Service: Orthopedics;  Laterality: Bilateral;       Home Medications    Prior to Admission medications   Medication Sig Start Date End Date Taking? Authorizing Provider  HYDROcodone-acetaminophen (NORCO) 5-325 MG tablet Take 1-2 tablets by mouth every 4 (four) hours as needed for moderate pain or severe pain. Patient not taking: Reported on 12/18/2016 09/02/15   Jetty Peeks, PA-C  oxyCODONE-acetaminophen (PERCOCET/ROXICET) 5-325 MG tablet Take 2 tablets by mouth  every 4 (four) hours as needed. 12/19/16   Rolland Porter, MD  oxyCODONE-acetaminophen (ROXICET) 5-325 MG tablet Take 1 tablet by mouth every 4 (four) hours as needed for severe pain. 12/27/16   Nadara Mustard, MD    Family History History reviewed. No pertinent family history.  Social History Social History  Substance Use Topics  . Smoking status: Current Every Day Smoker    Packs/day: 1.00    Years: 10.00    Types: Cigarettes  . Smokeless tobacco: Never Used  . Alcohol use Yes     Comment: 2-3 beers/day     Allergies   Patient has no known allergies.   Review of Systems Review of Systems   Physical Exam Updated Vital Signs BP 136/85 (BP Location: Right Arm)   Pulse 98   Resp 18   SpO2 100%   Physical Exam  Constitutional: He is oriented to person, place, and time. He appears well-developed and well-nourished. No distress.  HENT:  Head: Normocephalic.  Eyes: Pupils are equal, round, and reactive to light. Conjunctivae are normal. No scleral icterus.  Neck: Normal range of motion. Neck supple. No thyromegaly present.  Cardiovascular: Normal rate and regular rhythm.  Exam reveals no gallop and no friction rub.   No murmur heard. Pulmonary/Chest: Effort normal and breath sounds normal. No respiratory distress. He has no wheezes. He has no rales.  Abdominal: Soft. Bowel sounds are normal. He exhibits no distension. There is no tenderness. There is no rebound.  Musculoskeletal: Normal range of motion.  Complains of  pain "on my butt". In bilateral heels and ankles. Normal exam of the pelvis. Stable to palpation of anterior pelvis. Tenderness in the mid sacrum posteriorly. His marked swelling and tenderness of the bilateral ankles in the area of the calcaneus. No pain proximally at the knee, proximal fibula. No swelling or pain to the distal phalanxes her feet.  Neurological: He is alert and oriented to person, place, and time.  Skin: Skin is warm and dry. No rash noted.    Psychiatric: He has a normal mood and affect. His behavior is normal.     ED Treatments / Results  Labs (all labs ordered are listed, but only abnormal results are displayed) Labs Reviewed - No data to display  EKG  EKG Interpretation None       Radiology No results found.  Procedures Procedures (including critical care time)  Medications Ordered in ED Medications  oxyCODONE-acetaminophen (PERCOCET/ROXICET) 5-325 MG per tablet 2 tablet (2 tablets Oral Given 12/18/16 2230)  ondansetron (ZOFRAN) injection 4 mg (4 mg Intravenous Given 12/18/16 2340)  HYDROmorphone (DILAUDID) injection 1 mg (1 mg Intravenous Given 12/19/16 0222)     Initial Impression / Assessment and Plan / ED Course  I have reviewed the triage vital signs and the nursing notes.  Pertinent labs & imaging results that were available during my care of the patient were reviewed by me and considered in my medical decision making (see chart for details).    X-ray show bilateral calcaneal fractures. CT scans confirm comminuted calcaneal fractures. Pelvic film showed acute revised the lower spine film show no abnormalities. He is neurologically intact lower extremities. He will undergo orthopedic follow-up. He will be immobilized and his feet elevated. Placed in bulky Jones dressings with posterior splints. I did discuss with his facility with which she is incarcerated that he can be an infirmary he will be strict nonweightbearing wheelchair transfer only. Orthopedic follow-up.  Final Clinical Impressions(s) / ED Diagnoses   Final diagnoses:  Closed displaced fracture of calcaneus, unspecified laterality, unspecified portion of calcaneus, initial encounter    New Prescriptions Discharge Medication List as of 12/19/2016  1:20 AM    START taking these medications   Details  oxyCODONE-acetaminophen (PERCOCET/ROXICET) 5-325 MG tablet Take 2 tablets by mouth every 4 (four) hours as needed., Starting Wed 12/19/2016,  Print         Rolland Porter, MD 12/30/16 2046

## 2017-01-03 ENCOUNTER — Inpatient Hospital Stay (INDEPENDENT_AMBULATORY_CARE_PROVIDER_SITE_OTHER): Admitting: Orthopedic Surgery

## 2017-01-10 ENCOUNTER — Encounter (INDEPENDENT_AMBULATORY_CARE_PROVIDER_SITE_OTHER): Payer: Self-pay | Admitting: Orthopedic Surgery

## 2017-01-10 ENCOUNTER — Ambulatory Visit (INDEPENDENT_AMBULATORY_CARE_PROVIDER_SITE_OTHER)

## 2017-01-10 ENCOUNTER — Ambulatory Visit (INDEPENDENT_AMBULATORY_CARE_PROVIDER_SITE_OTHER): Admitting: Orthopedic Surgery

## 2017-01-10 DIAGNOSIS — S92001S Unspecified fracture of right calcaneus, sequela: Secondary | ICD-10-CM

## 2017-01-10 DIAGNOSIS — S92002S Unspecified fracture of left calcaneus, sequela: Secondary | ICD-10-CM

## 2017-01-10 NOTE — Progress Notes (Signed)
   Post-Op Visit Note   Patient: [name]           Date of Birth: [birth year]           MRN: [id number] Visit Date: 01/10/2017 PCP: Patient, No Pcp Per  Chief Complaint:  Chief Complaint  Patient presents with  . Right Foot - Routine Post Op  . Left Foot - Routine Post Op    HPI:  HPI Patient is a 31 year old gentleman seen 2 weeks status post ORIF for bilateral calcaneal fractures. Has been nonweight bearing. Today not wearing CAM boots, does [facility] these at detention center.   Ortho Exam Right heel incision is well healed. Sutures have been harvested. The left heel incision is healing well. No erythema, gaping or drainage. Will leave sutures for one more week.  Visit Diagnoses:  1. Closed fracture of both calcanei, sequela     Plan: continue daily wound cleansing with soap and water. May begin showering. Continue nonweight bearing bilaterally. Follow-up in  For suture removal on the left and likely will advance weightbearing in cam boot on the right.  Follow-Up Instructions: Return in about 1 week (around 01/17/2017) for suture removal.   Imaging: No results found.  Orders:  Orders Placed This Encounter  Procedures  . XR Os Calcis Left  . XR Os Calcis Right   No orders of the defined types were placed in this encounter.    PMFS History: Patient Active Problem List   Diagnosis Date Noted  . Bilateral calcaneal fractures 12/26/2016  . Closed bilateral calcaneal fractures, initial encounter 12/25/2016  . Retained orthopedic hardware 09/02/2015   Past Medical History:  Diagnosis Date  . Bipolar disorder (HCC)   . Full dentures   . Retained orthopedic hardware 08/2015   left ankle  . Runny nose 08/26/2015   clear drainage, per pt.    History reviewed. No pertinent family history.  Past Surgical History:  Procedure Laterality Date  . FRACTURE SURGERY    . HARDWARE REMOVAL Left 09/02/2015   Procedure: LEFT ANKLE HARDWARE REMOVAL;  Surgeon: Sheral Apley,  MD;  Location: [facility];  Service: Orthopedics;  Laterality: Left;  . ORIF ANKLE FRACTURE Left 05/2015  . ORIF CALCANEAL FRACTURE Bilateral 12/26/2016  . ORIF CALCANEOUS FRACTURE Bilateral 12/26/2016   Procedure: OPEN REDUCTION INTERNAL FIXATION (ORIF) BILATERAL CALCANEOUS FRACTURE;  Surgeon: Nadara Mustard, MD;  Location: [facility] OR;  Service: Orthopedics;  Laterality: Bilateral;   Social History   Occupational History  . Not on file.   Social History Main Topics  . Smoking status: Current Every Day Smoker    Packs/day: 1.00    Years: 10.00    Types: Cigarettes  . Smokeless tobacco: Never Used  . Alcohol use Yes     Comment: 2-3 beers/day  . Drug use: No  . Sexual activity: No

## 2017-01-18 ENCOUNTER — Encounter (INDEPENDENT_AMBULATORY_CARE_PROVIDER_SITE_OTHER): Payer: Self-pay | Admitting: Family

## 2017-01-18 ENCOUNTER — Ambulatory Visit (INDEPENDENT_AMBULATORY_CARE_PROVIDER_SITE_OTHER): Admitting: Family

## 2017-01-18 DIAGNOSIS — S92001D Unspecified fracture of right calcaneus, subsequent encounter for fracture with routine healing: Secondary | ICD-10-CM

## 2017-01-18 DIAGNOSIS — S92002D Unspecified fracture of left calcaneus, subsequent encounter for fracture with routine healing: Secondary | ICD-10-CM

## 2017-01-18 NOTE — Progress Notes (Signed)
   Post-Op Visit Note   Patient: [name]           Date of Birth: Sep 19, 1985           MRN: [id number] Visit Date: 01/18/2017 PCP: Patient, No Pcp Per  Chief Complaint:  No chief complaint on file.   HPI:  HPI Patient is a 31 year old gentleman seen today status post ORIF for bilateral calcaneal fractures on 12/26/16. Has been nonweight bearing. Today wearing CAM boots, unsure if has crutches at detention center.   Ortho Exam Right heel incision is well healed. Sutures have been harvested. The left heel incision is healing well. No erythema, gaping or drainage. Minimal bony tenderness.  Visit Diagnoses:  1. Closed fracture of both calcanei with routine healing, subsequent encounter     Plan: sutures harvested on left today. continue daily wound cleansing with soap and water. May begin weight bearing in CAM on right. Continue nonweight bearing on left. Follow-up in 3 weeks with repeat radiographs of bilateral foot/heels.   Follow-Up Instructions: No Follow-up on file.   Imaging: No results found.  Orders:  No orders of the defined types were placed in this encounter.  No orders of the defined types were placed in this encounter.    PMFS History: Patient Active Problem List   Diagnosis Date Noted  . Bilateral calcaneal fractures 12/26/2016  . Closed bilateral calcaneal fractures, initial encounter 12/25/2016  . Retained orthopedic hardware 09/02/2015   Past Medical History:  Diagnosis Date  . Bipolar disorder (HCC)   . Full dentures   . Retained orthopedic hardware 08/2015   left ankle  . Runny nose 08/26/2015   clear drainage, per pt.    No family history on file.  Past Surgical History:  Procedure Laterality Date  . FRACTURE SURGERY    . HARDWARE REMOVAL Left 09/02/2015   Procedure: LEFT ANKLE HARDWARE REMOVAL;  Surgeon: Sheral Apleyimothy D Murphy, MD;  Location: [facility];  Service: Orthopedics;  Laterality: Left;  . ORIF ANKLE FRACTURE Left 05/2015    . ORIF CALCANEAL FRACTURE Bilateral 12/26/2016  . ORIF CALCANEOUS FRACTURE Bilateral 12/26/2016   Procedure: OPEN REDUCTION INTERNAL FIXATION (ORIF) BILATERAL CALCANEOUS FRACTURE;  Surgeon: Nadara Mustarduda, Marcus V, MD;  [facility];  Service: Orthopedics;  Laterality: Bilateral;   Social History   Occupational History  . Not on file.   Social History Main Topics  . Smoking status: Current Every Day Smoker    Packs/day: 1.00    Years: 10.00    Types: Cigarettes  . Smokeless tobacco: Never Used  . Alcohol use Yes     Comment: 2-3 beers/day  . Drug use: No  . Sexual activity: No

## 2017-02-07 ENCOUNTER — Ambulatory Visit (INDEPENDENT_AMBULATORY_CARE_PROVIDER_SITE_OTHER)

## 2017-02-07 ENCOUNTER — Ambulatory Visit (INDEPENDENT_AMBULATORY_CARE_PROVIDER_SITE_OTHER): Admitting: Orthopedic Surgery

## 2017-02-07 ENCOUNTER — Encounter (INDEPENDENT_AMBULATORY_CARE_PROVIDER_SITE_OTHER): Payer: Self-pay | Admitting: Orthopedic Surgery

## 2017-02-07 VITALS — Ht 65.0 in | Wt 140.0 lb

## 2017-02-07 DIAGNOSIS — M79671 Pain in right foot: Secondary | ICD-10-CM

## 2017-02-07 DIAGNOSIS — M79672 Pain in left foot: Secondary | ICD-10-CM

## 2017-02-07 DIAGNOSIS — S92001S Unspecified fracture of right calcaneus, sequela: Secondary | ICD-10-CM

## 2017-02-07 DIAGNOSIS — S92002S Unspecified fracture of left calcaneus, sequela: Secondary | ICD-10-CM

## 2017-02-07 NOTE — Progress Notes (Signed)
Office Visit Note   Patient: [name]           Date of Birth: [birth year]           MRN: [id number] Visit Date: 02/07/2017              Requested by: No referring provider defined for this encounter. PCP: Patient, No Pcp Per  Chief Complaint  Patient presents with  . Left Foot - Routine Post Op    12/26/16 bilat ORIF calcaneous fx  . Right Foot - Routine Post Op      HPI: Patient presents 6 weeks status post open reduction internal fixation bilateral calcaneal fractures.  Patient states he still has pain with weightbearing he is in fracture boots bilaterally currently ambulating in a wheelchair.  Assessment & Plan: Visit Diagnoses:  1. Pain of both heels   2. Closed fracture of both calcanei, sequela     Plan: Recommended increasing his activities as tolerated.  He has no restrictions at this time.  Discussed that it would take about a year for the feet to completely resolve from his injury.  Recommended Shea butter or cocoa butter for scar massage for a year.  Follow-Up Instructions: Return if symptoms worsen or fail to improve.   Ortho Exam  Patient is alert, oriented, no adenopathy, well-dressed, normal affect, normal respiratory effort. Examination patient has excellent subtalar and ankle range of motion bilaterally.  The incisions have healed well there is no keloiding of the scar there is no redness no cellulitis no drainage no signs of infection no signs of DVT.  Imaging: Xr Foot 2 Views Left  Result Date: 02/07/2017 2 view radiographs of the left calcaneus shows restoration of the medial column of the hindfoot is not and varus.  The posterior facet is congruent.  Xr Foot 2 Views Right  Result Date: 02/07/2017 2 view radiographs of the right calcaneus shows a congruent posterior facet subtalar joint as well as no varus alignment with restoration of the medial column.  No images are attached to the encounter.  Labs: No results found for: HGBA1C,  ESRSEDRATE, CRP, LABURIC, REPTSTATUS, GRAMSTAIN, CULT, LABORGA  Orders:  Orders Placed This Encounter  Procedures  . XR Foot 2 Views Left  . XR Foot 2 Views Right   No orders of the defined types were placed in this encounter.    Procedures: No procedures performed  Clinical Data: No additional findings.  ROS:  All other systems negative, except as noted in the HPI. Review of Systems  Objective: Vital Signs: Ht 5\' 5"  (1.651 m)   Wt 140 lb (63.5 kg)   BMI 23.30 kg/m   Specialty Comments:  No specialty comments available.  PMFS History: Patient Active Problem List   Diagnosis Date Noted  . Closed fracture of both calcanei 12/26/2016  . Closed bilateral calcaneal fractures, initial encounter 12/25/2016  . Retained orthopedic hardware 09/02/2015   Past Medical History:  Diagnosis Date  . Bipolar disorder (HCC)   . Full dentures   . Retained orthopedic hardware 08/2015   left ankle  . Runny nose 08/26/2015   clear drainage, per pt.    History reviewed. No pertinent family history.  Past Surgical History:  Procedure Laterality Date  . FRACTURE SURGERY    . ORIF ANKLE FRACTURE Left 05/2015  . ORIF CALCANEAL FRACTURE Bilateral 12/26/2016   Social History   Occupational History  . Not on file  Tobacco Use  . Smoking status: Current Every  Day Smoker    Packs/day: 1.00    Years: 10.00    Pack years: 10.00    Types: Cigarettes  . Smokeless tobacco: Never Used  Substance and Sexual Activity  . Alcohol use: Yes    Comment: 2-3 beers/day  . Drug use: No  . Sexual activity: No    Birth control/protection: None

## 2017-03-27 ENCOUNTER — Telehealth (INDEPENDENT_AMBULATORY_CARE_PROVIDER_SITE_OTHER): Payer: Self-pay | Admitting: Orthopedic Surgery

## 2017-03-27 NOTE — Telephone Encounter (Signed)
Patient called asking for a note asked for his probation officer stating that how long he is out of work. If it could be faxed to [phone].   Please give him a call whenever it's been faxed over. [phone]

## 2017-03-30 NOTE — Telephone Encounter (Signed)
He could return to work without restrictions 04/02/17

## 2017-04-01 NOTE — Telephone Encounter (Signed)
I called and advised patient note was faxed.

## 2019-05-04 IMAGING — CR DG OS CALCIS 2+V*R*
2 series · 2 of 2 positions shown · non-contrast
Comparison: None.

CLINICAL DATA: Initial evaluation for acute trauma, fall foot
jumped.

EXAM:
RIGHT OS CALCIS - 2+ VIEW

[x calcaneus lat right]
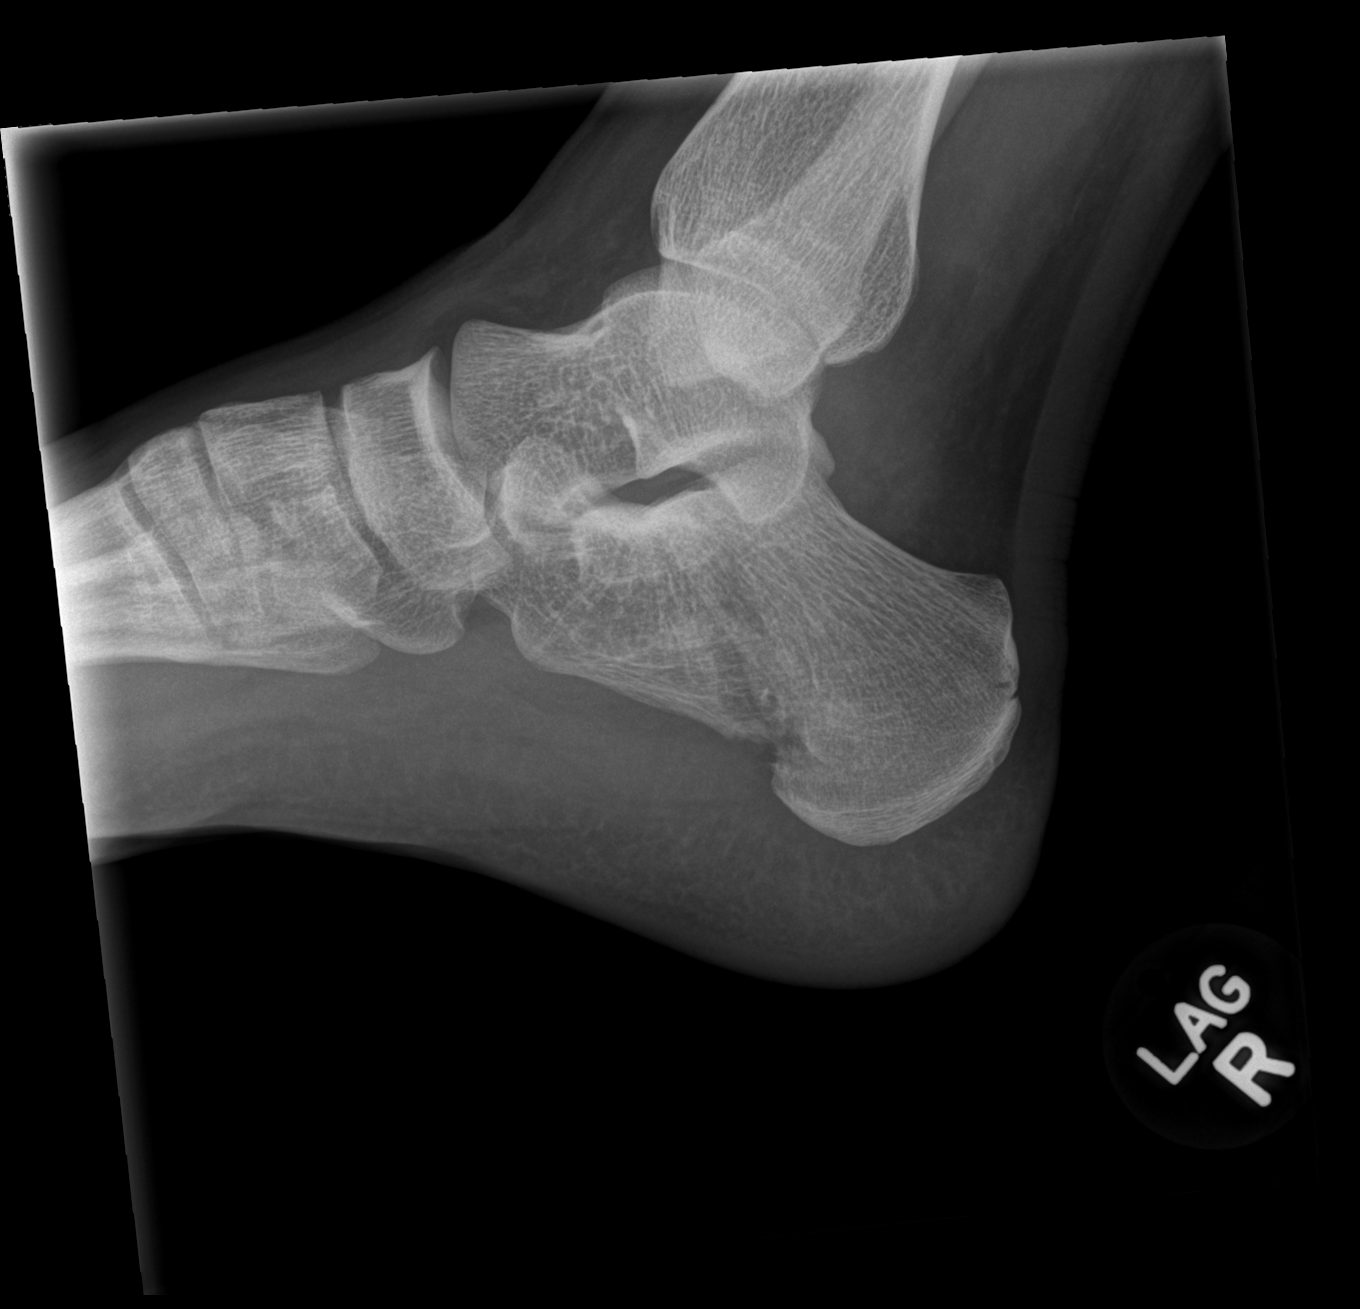

[x calcaneus axial right]
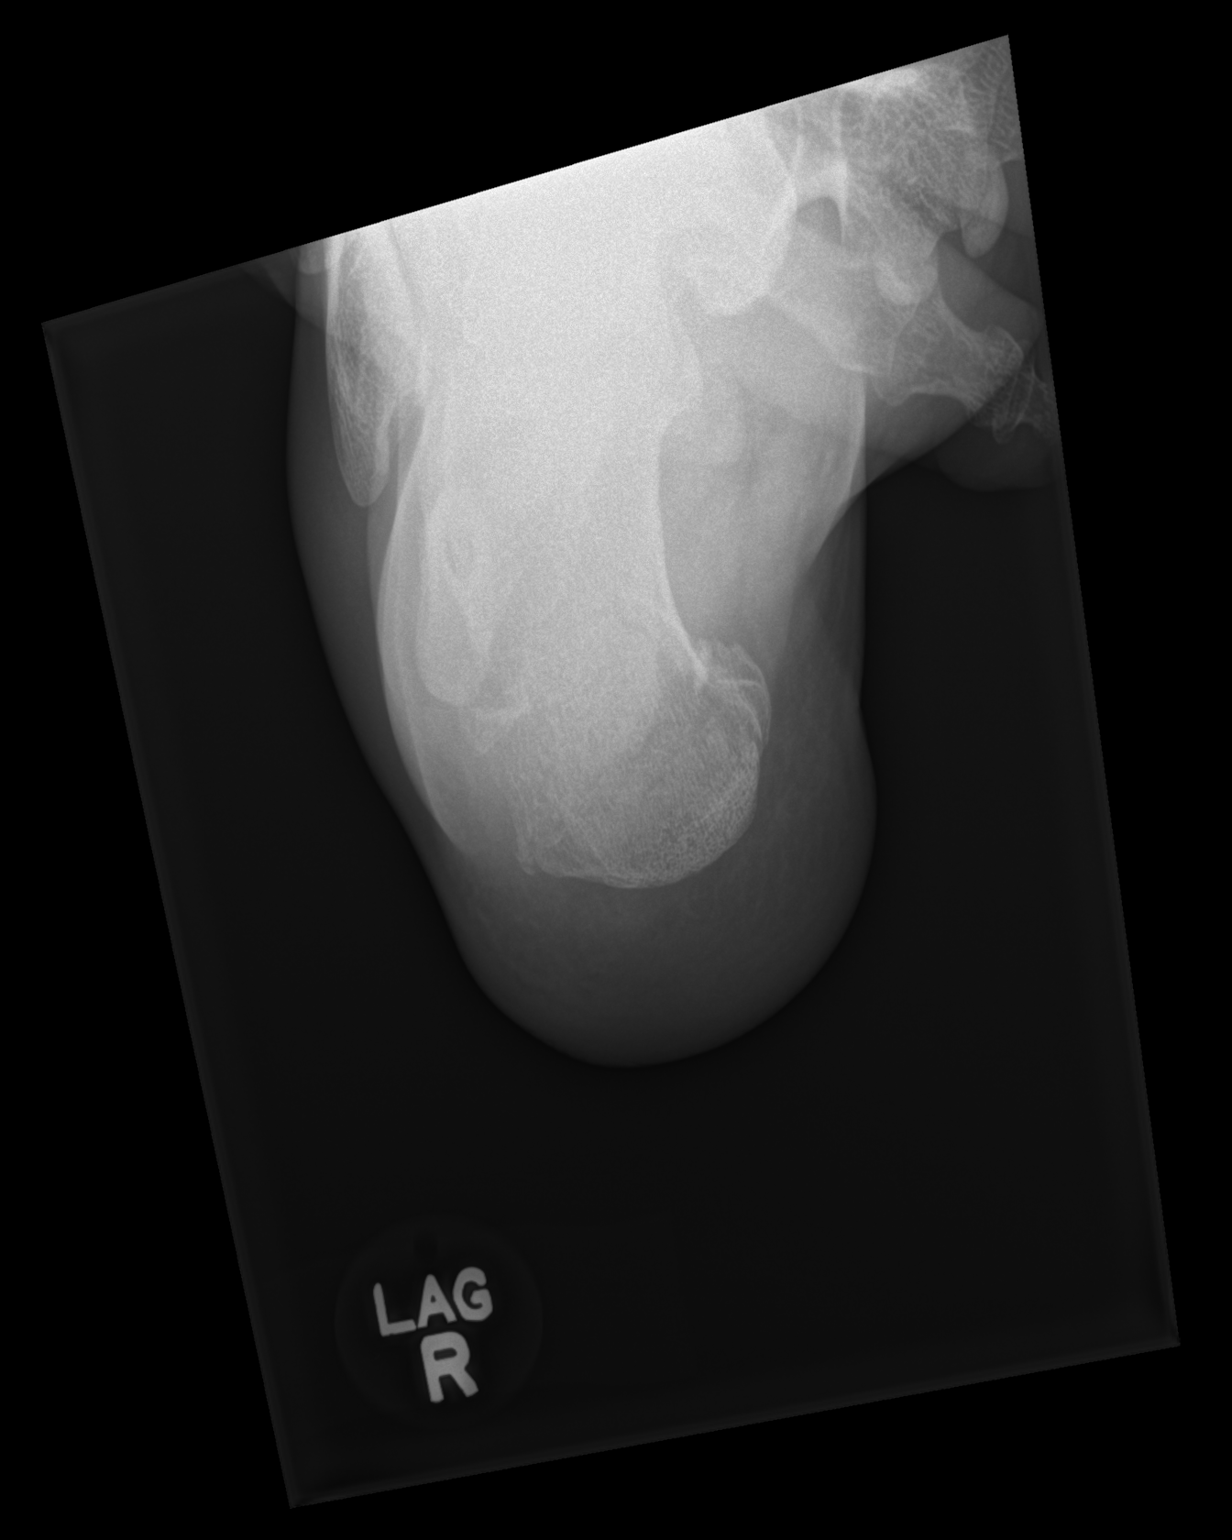

[2 of 2 positions shown; findings below may reference images not displayed]

FINDINGS: Acute comminuted minimally displaced fracture of the posterior
calcaneus. Subtalar joint remains grossly approximated. No
appreciable soft tissue injury.
IMPRESSION: Acute comminuted minimally displaced fracture of the posterior
calcaneus.
